# Patient Record
Sex: Male | Born: 1955
Health system: Southern US, Community
[De-identification: ages and names within clinical notes are randomized; demographics above are authoritative.]

## PROBLEM LIST (undated history)

## (undated) DIAGNOSIS — C801 Malignant (primary) neoplasm, unspecified: Secondary | ICD-10-CM

## (undated) DIAGNOSIS — K219 Gastro-esophageal reflux disease without esophagitis: Secondary | ICD-10-CM

## (undated) DIAGNOSIS — I4891 Unspecified atrial fibrillation: Secondary | ICD-10-CM

## (undated) HISTORY — PX: ANKLE ARTHROSCOPY: SUR85

## (undated) HISTORY — PX: COLONOSCOPY: SHX174

## (undated) HISTORY — PX: KNEE ARTHROSCOPY: SUR90

---

## 2000-11-07 ENCOUNTER — Encounter: Payer: Self-pay | Admitting: Orthopedic Surgery

## 2000-11-07 ENCOUNTER — Encounter: Payer: Self-pay | Admitting: Specialist

## 2000-11-07 ENCOUNTER — Inpatient Hospital Stay (HOSPITAL_COMMUNITY): Admission: EM | Admit: 2000-11-07 | Discharge: 2000-11-12 | Payer: Self-pay

## 2001-01-01 ENCOUNTER — Inpatient Hospital Stay (HOSPITAL_COMMUNITY): Admission: EM | Admit: 2001-01-01 | Discharge: 2001-01-02 | Payer: Self-pay

## 2004-03-29 ENCOUNTER — Emergency Department (HOSPITAL_COMMUNITY): Admission: EM | Admit: 2004-03-29 | Discharge: 2004-03-29 | Payer: Self-pay | Admitting: *Deleted

## 2004-11-26 ENCOUNTER — Encounter: Admission: RE | Admit: 2004-11-26 | Discharge: 2004-11-26 | Payer: Self-pay | Admitting: Orthopedic Surgery

## 2005-07-27 ENCOUNTER — Observation Stay (HOSPITAL_COMMUNITY): Admission: EM | Admit: 2005-07-27 | Discharge: 2005-07-28 | Payer: Self-pay | Admitting: Emergency Medicine

## 2006-02-24 ENCOUNTER — Emergency Department (HOSPITAL_COMMUNITY): Admission: EM | Admit: 2006-02-24 | Discharge: 2006-02-25 | Payer: Self-pay | Admitting: Emergency Medicine

## 2006-03-22 ENCOUNTER — Encounter: Admission: RE | Admit: 2006-03-22 | Discharge: 2006-03-22 | Payer: Self-pay | Admitting: Emergency Medicine

## 2007-06-19 ENCOUNTER — Encounter: Admission: RE | Admit: 2007-06-19 | Discharge: 2007-06-19 | Payer: Self-pay | Admitting: Emergency Medicine

## 2008-01-30 ENCOUNTER — Encounter: Admission: RE | Admit: 2008-01-30 | Discharge: 2008-01-30 | Payer: Self-pay | Admitting: Emergency Medicine

## 2008-08-13 ENCOUNTER — Emergency Department (HOSPITAL_BASED_OUTPATIENT_CLINIC_OR_DEPARTMENT_OTHER): Admission: EM | Admit: 2008-08-13 | Discharge: 2008-08-13 | Payer: Self-pay | Admitting: Emergency Medicine

## 2008-08-13 ENCOUNTER — Ambulatory Visit: Payer: Self-pay | Admitting: Radiology

## 2008-09-08 ENCOUNTER — Emergency Department (HOSPITAL_COMMUNITY): Admission: EM | Admit: 2008-09-08 | Discharge: 2008-09-08 | Payer: Self-pay | Admitting: Emergency Medicine

## 2008-10-02 ENCOUNTER — Emergency Department (HOSPITAL_COMMUNITY): Admission: EM | Admit: 2008-10-02 | Discharge: 2008-10-03 | Payer: Self-pay | Admitting: Emergency Medicine

## 2009-11-11 ENCOUNTER — Encounter: Admission: RE | Admit: 2009-11-11 | Discharge: 2009-11-11 | Payer: Self-pay | Admitting: Orthopedic Surgery

## 2010-08-25 LAB — POCT I-STAT, CHEM 8
BUN: 23 mg/dL (ref 6–23)
HCT: 43 % (ref 39.0–52.0)
Sodium: 139 mEq/L (ref 135–145)
TCO2: 22 mmol/L (ref 0–100)

## 2010-08-26 LAB — GLUCOSE, CAPILLARY: Glucose-Capillary: 91 mg/dL (ref 70–99)

## 2010-10-01 NOTE — Discharge Summary (Signed)
[place]. [facility]  Patient:    John Donaldson, John Donaldson                       MRN: [id number] Adm. Date:  [id number] Disc. Date: [id number] Attending:  Montez Morita, Philips John Dictator:   Druscilla Brownie. Shela Nevin, P.A. CC:         Rockey Situ. Flavia Shipper., M.D.  Helene Kelp, M.D.   Discharge Summary  ADMITTING DIAGNOSES: 1. Sprain strain right quadricep mechanism as well as to the toe and the ankle    on the right. 2. Under treatment with Levaquin for sinusitis. 3. Rule out infective process of multiple joints.  DISCHARGE DIAGNOSES: 1. Reactive polyarthritis. 2. Sinusitis, resolved.  CONSULTATIONS: 1. Dr. Lenn Sink, infectious disease. 2. Dr. Chase Picket, rheumatology.  PROCEDURE:  On November 08, 2000, the patient underwent irrigation of both knees and right ankle with cultures of synovial fluid.  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  The patient presented in the emergency room for evaluation.  He has been in good health prior to this admission.  He developed a sinusitis as well as a fever and was seen by his family physician who began him on Levaquin.  The patient also had tenderness in the prostate by his family physician and was treated for a mild prostatitis as well.  This is a very active gentleman and he was playing softball shortly before he began having increasing pain into his lower extremity joints.  He specifically remembers rounding second base stepping on the bag "wrong" and having pain into the knee.  He thought he had "torn" something.  He had increased pain and swelling into his knee and felt quite sick as well as having a fever.  He collapsed at home on the floor and due to the pain in his lower extremity joints, it took him some time to reach the telephone to call for EMS.   He was then brought to the emergency room.  In the emergency room, his main complaint was pain in the knees and effusion was seen in the right knee.  He also had some redness and effusion into the right ankle as well.  Movement of the right MP joint was also uncomfortable. He did have some left knee pain as well.  Due to the fever, as well as the history, it was felt this patient could have an infected process into his lower extremity joints and for this reason, he was taken to the operating room for the above procedure.  Gram stain showed 52,000 wbcs without crystals.  No organisms were seen.  It should be noted that the patient had been on Levaquin for his sinusitis prostatitis.  We asked Dr. Ponciano Ort to see the patient and he felt that due to the polyarthralgia that it may have been a rheumatologic process.  We also called for a rheumatology consult.  HOSPITAL COURSE:  Dr. Roxan Hockey recommended continuing the patient on doxycycline as some causes of reactive arthritis might respond to that. Dr. Phylliss Bob started the patient on IV Solu-Medrol.  With this treatment regimen, the patient did improve.  He was able to begin ambulating in the room and then into the hall with the aide of crutches.  He had no pronounced increases of effusion  to his knees or other lower extremity joints.  Overall, his discomfort improved and erythema faded.  He was switched to p.o. steroids prior to discharge.  Laboratory values continued to show no growth in the synovial fluid analysis.  White count remained normal.  Blood chemistries were also normal.  IgG was 980 which was normal, IgM was 112 which was normal. Hepatitis B surface antigen was negative.  Anti-HCV was negative.  Anti-HAV IgM was negative.  HB core IgM was negative.  Human parvo virus was pending as far as these notes were concerned. Rheumatoid factor was negative.  HLAB-27 was negative.  Unfortunately, sedimentation rate was never performed due  to "test request received without appropriate specimen."  No chest x-ray or electrocardiogram seen on this chart.  CONDITION ON DISCHARGE:  Improved and stable.  DISCHARGE MEDICATIONS: 1. Doxycycline 100 mg one p.o. b.i.d. x 7 days at discharge. 2. Prednisone.  Dr. Leslee Home saw the patient on the day of discharge and    recommended prednisone 10 mg one b.i.d.  FOLLOWUP:  He is to return to see Dr. Montez Morita on November 20, 2000. DD:  11/21/00 TD:  11/21/00 Job: [id number]

## 2010-10-01 NOTE — Op Note (Signed)
[place]. [facility]  Patient:    [name], [name] Visit Number: [id number] MRN: [id number]          Service Type: MED Location: 5000 [id number] Attending Physician:  Colbert Ewing Proc. Date: 11/08/00 Admit Date:  11/07/2000                             Operative Report  PREOPERATIVE DIAGNOSIS:  Septic arthritis, both knees, possible right ankle.  POSTOPERATIVE DIAGNOSIS:  Septic arthritis of both knees and right ankle.  OPERATION PERFORMED:  Irrigation of all three joints.  SURGEON:  Philips J. Montez Morita, M.D.  ANESTHESIA:  General.  DESCRIPTION OF PROCEDURE:  After suitable general anesthesia, both knees were prepped with a double hole draping system and lateral puncture wounds were made in the parapatellar area.  Introduction of a large arthroscopy inflow cannula which in both knees produces a thick, heavy obvious fluid very similar to what was aspirated on  the floor.  We then proceeded to run 3000 cc of fluid through each with clearing up right after the first 200 cc with an inflow portal created in each using an arthroscope through anterolateral arthroscope cannula through an anterolateral portal for the inflow.  At the conclusion of that 500 cc of triple antibiotic were used as the last irrigating solution and the two puncture wounds on both knees were closed. The right ankle was then prepped with Betadine and aspirated with an 18 needle going in over the area of fullness which was anterolaterally and got about 4 cc of thick pus.  We then filled that area with the 18 gauge needle in place with saline in a stab wound and brought in the cannula for the arthroscope and then irrigated through there until it was clear and wound up with 500 cc of triple antibiotic as well.  One suture in that area.  Compression dressing for that and he goes to recovery in good condition. Attending Physician:  Colbert Ewing DD:  11/08/00 TD:   11/08/00 Job: [id number]

## 2010-10-01 NOTE — Discharge Summary (Signed)
John Donaldson, John Donaldson                ACCOUNT NO.:  [id number]   MEDICAL RECORD NO.:  [id number]          PATIENT TYPE:  INP   LOCATION:  6525                         FACILITY:  [facility]   PHYSICIAN:  Nanetta Batty, M.D.   DATE OF BIRTH:  [birth year]   DATE OF ADMISSION:  07/27/2005  DATE OF DISCHARGE:  07/28/2005                                 DISCHARGE SUMMARY   DISCHARGE DIAGNOSES:  1.  Atrial flutter with moderate ventricular response converted to sinus      rhythm on Cardizem intravenous.  2.  Reactive polyarthritis with a history of septic arthritis of bilateral      both knees and right ankle.   HISTORY OF PRESENT ILLNESS/HOSPITAL COURSE:  This is a 55 year old Caucasian  gentleman who presented to [facility] with fluttering sensation in  his chest while drinking a cold water after playing golf.  There were no  pain, no shortness of breath or tightness or heaviness or pressure in the  chest.  No nausea, diaphoresis.  He was put on telemetry monitoring.  He was  found to be in atrial flutter with response, with heart rate in the 80s and  90s.   PAST MEDICAL HISTORY:  Significant for reactive polyarthritis in the past.  The patient underwent irrigation of both knees and right ankle for septic  arthritis and it was done in 2002.   SOCIAL HISTORY:  He is married with two children.  Works as a Emergency planning/management officer  in [place].  He doesn't smoke cigarettes and does not have any  significant alcohol consumption.   FAMILY HISTORY:  Negative for coronary artery disease.   He was admitted to the telemetry unit, started on IV Cardizem drip and  converted to normal sinus rhythm around 4 a.m. in the morning.  Dr. Allyson Sabal  saw the patient in the morning and considered him to be stable for discharge  home.   His laboratories were as following:  Sodium 141, potassium 3.6, chloride  108, CO2 29, BUN 18, creatinine 1.2, glucose 93.  His hemoglobin was 14.8,  hematocrit 41.8.  First set  of enzymes was CK 287, CK-MB 2.9, troponin 0.02.  Second set revealed CK of 245, CK-MB 1.6, troponin 0.02.  TSH was pending at  the time of discharge.   We are going to discharge the patient home with the following  recommendations:  1.  Start aspirin p.o. 81 mg daily.  2.  Continue regular activity and diet.   DISCHARGE FOLLOWUP:  He was scheduled for a 2-D echo in the second floor in  our office on 08/02/2005 at 10 o'clock in the morning and then Dr. Allyson Sabal  will see the patient on 08/04/2005 at 8:30 a.m.   The patient was given 40 mEq of potassium prior to his discharge home.      Raymon Mutton, P.A.      Nanetta Batty, M.D.  Electronically Signed    MK/MEDQ  D:  07/28/2005  T:  07/29/2005  Job:  [id number]

## 2010-10-01 NOTE — H&P (Signed)
[name], [name]                ACCOUNT NO.:  [id number]   MEDICAL RECORD NO.:  [id number]          PATIENT TYPE:  EMS   LOCATION:  MAJO                         FACILITY:  [facility]   PHYSICIAN:  Ulyses Amor, MD DATE OF BIRTH:  1955-06-09   DATE OF ADMISSION:  07/27/2005  DATE OF DISCHARGE:                                HISTORY & PHYSICAL   HISTORY OF PRESENT ILLNESS:  John Donaldson is a 55 year old white man who is  admitted to [facility] for atrial flutter.   The patient, who has no past history of cardiac disease, experienced the  onset of fluttering in his chest while drinking cold water after hitting  golf balls today.  There was no associated chest pain, tightness, heaviness,  pressure or squeezing, nor was then any dyspnea, diaphoresis, nausea,  dizziness, lightheadedness, syncope or near syncope.  He presented to the  emergency department where he was found to be in atrial flutter with a  moderate ventricular rate.  Hence, he is admitted for further evaluation and  management.   As noted, the patient has no past history of cardiac disease, including no  history of chest pain, myocardial infarction, coronary artery disease,  congestive heart failure, or arrhythmia.  He has no risk factors for  coronary artery disease including no history of hypertension,  hyperlipidemia, diagnosis, smoking or family history of early coronary  artery disease.   MEDICATIONS:  None.   ALLERGIES:  None.   OPERATIONS:  Bilateral knee and one ankle arthroscopies.   SIGNIFICANT INJURIES:  None.   SOCIAL HISTORY:  The patient lives with his wife.  He has 2 children  including a 47-month-old.  He is a Event organiser.  He does not  smoke cigarettes.  He has no significant alcohol consumption.   REVIEW OF SYSTEMS:  No problems related to his head, eyes, ears, nose,  mouth, throat, lungs, gastrointestinal system, genitourinary system, or  extremities.  There is no  history of neurologic or psychiatric disorder.  There is no history of fever, chills or weight loss.   PHYSICAL EXAMINATION:  VITAL SIGNS:  Blood pressure 101/73, pulse 83 and  regular, respirations 20, temperature 98.2.  GENERAL:  The patient was a middle-aged white man in no discomfort.  He was  alert, oriented, appropriate and responsive.  HEENT:  Head, eyes, nose and mouth were normal.  NECK:  Without thyromegaly or adenopathy.  Carotid pulses were palpable  bilaterally and without bruits.  CARDIAC:  A regular rhythm.  There was no gallop, murmur, rub or click.  CHEST:  No chest wall tenderness was noted.  LUNGS:  Clear.  ABDOMEN:  Soft and nontender.  There was no mass, hepatosplenomegaly, bruit,  distention, rebound, guarding or rigidity.  Bowel sounds were normal.  RECTAL/GENITAL:  Not performed, as they were not pertinent to the reason for  acute care hospitalization.  EXTREMITIES:  Without edema, deviation or deformity.  Radial and dorsalis  pedis pulses were palpable bilaterally.  NEUROLOGIC:  Brief screening neurologic survey was unremarkable.   ELECTROCARDIOGRAM:  Atrial flutter with a moderate  ventricular rate.  There  were nonspecific ST-T wave changes related to the flutter wave.  There were  no changes specific for ischemia or infarction.   CHEST RADIOGRAPH:  No evidence of acute cardiopulmonary disease.   LABORATORY DATA:  White count was 5.2 with a hemoglobin of 15.0 and  hematocrit of 41.9.  Potassium of 3.6, BUN 18, and creatinine 1.2.  The  initial set of cardiac markers revealed a myoglobin of 105, CK-MB 1.4 and  troponin less than 0.05.  The second set of cardiac markers revealed a  myoglobin of 57.2, CK-MB 1.2 and troponin less than 0.05.  The remaining  studies were pending at the time of this dictation.   IMPRESSION:  Atrial flutter with moderate ventricular rate, onset today.   PLAN:  1.  Telemetry.  2.  Serial cardiac enzymes.  3.  Aspirin.  4.   Intravenous heparin.  5.  Echocardiogram.  6.  Thyroid function tests.  7.  Further measures per Dr. Allyson Sabal.      Ulyses Amor, MD  Electronically Signed     MSC/MEDQ  D:  07/27/2005  T:  07/27/2005  Job:  [id number]   cc:   Nanetta Batty, M.D.  Fax: [phone]

## 2016-04-27 ENCOUNTER — Other Ambulatory Visit: Payer: Self-pay | Admitting: Internal Medicine

## 2016-04-27 DIAGNOSIS — N5089 Other specified disorders of the male genital organs: Secondary | ICD-10-CM

## 2016-05-04 ENCOUNTER — Ambulatory Visit
Admission: RE | Admit: 2016-05-04 | Discharge: 2016-05-04 | Disposition: A | Discharge status: Home / Self Care | Payer: Self-pay | Source: Ambulatory Visit | Attending: Internal Medicine | Admitting: Internal Medicine

## 2016-05-04 DIAGNOSIS — N5089 Other specified disorders of the male genital organs: Secondary | ICD-10-CM

## 2017-05-04 ENCOUNTER — Emergency Department (HOSPITAL_COMMUNITY): Payer: PRIVATE HEALTH INSURANCE

## 2017-05-04 ENCOUNTER — Other Ambulatory Visit: Payer: Self-pay

## 2017-05-04 ENCOUNTER — Encounter (HOSPITAL_COMMUNITY): Payer: Self-pay

## 2017-05-04 DIAGNOSIS — I4510 Unspecified right bundle-branch block: Secondary | ICD-10-CM | POA: Diagnosis not present

## 2017-05-04 DIAGNOSIS — R0789 Other chest pain: Secondary | ICD-10-CM | POA: Diagnosis not present

## 2017-05-04 DIAGNOSIS — R079 Chest pain, unspecified: Secondary | ICD-10-CM | POA: Diagnosis present

## 2017-05-04 LAB — BASIC METABOLIC PANEL
ANION GAP: 11 (ref 5–15)
BUN: 18 mg/dL (ref 6–20)
CO2: 24 mmol/L (ref 22–32)
Calcium: 8.5 mg/dL — ABNORMAL LOW (ref 8.9–10.3)
Chloride: 102 mmol/L (ref 101–111)
Creatinine, Ser: 0.94 mg/dL (ref 0.61–1.24)
Glucose, Bld: 123 mg/dL — ABNORMAL HIGH (ref 65–99)
POTASSIUM: 4.1 mmol/L (ref 3.5–5.1)
SODIUM: 137 mmol/L (ref 135–145)

## 2017-05-04 LAB — I-STAT TROPONIN, ED: Troponin i, poc: 0 ng/mL (ref 0.00–0.08)

## 2017-05-04 LAB — CBC
HEMATOCRIT: 41.5 % (ref 39.0–52.0)
Hemoglobin: 14.6 g/dL (ref 13.0–17.0)
MCH: 32.6 pg (ref 26.0–34.0)
MCHC: 35.2 g/dL (ref 30.0–36.0)
MCV: 92.6 fL (ref 78.0–100.0)
Platelets: 141 10*3/uL — ABNORMAL LOW (ref 150–400)
RBC: 4.48 MIL/uL (ref 4.22–5.81)
RDW: 13.4 % (ref 11.5–15.5)
WBC: 4.6 10*3/uL (ref 4.0–10.5)

## 2017-05-04 NOTE — ED Triage Notes (Signed)
Pt endorses eating at 1500 and after began to feel mid chest pressure without radiation, denies any other sx. Has hx of GERD and usually acid reflux med and baking soda relieves it but today it did not. Given asa 325 and 1 nitro with relief, now pain is 1/10. Has hx of a-fib. VSS. NSR with right BBB which is new.

## 2017-05-05 ENCOUNTER — Emergency Department (HOSPITAL_COMMUNITY)
Admission: EM | Admit: 2017-05-05 | Discharge: 2017-05-05 | Disposition: A | Discharge status: Home / Self Care | Payer: PRIVATE HEALTH INSURANCE | Attending: Emergency Medicine | Admitting: Emergency Medicine

## 2017-05-05 DIAGNOSIS — R0789 Other chest pain: Secondary | ICD-10-CM

## 2017-05-05 DIAGNOSIS — I4510 Unspecified right bundle-branch block: Secondary | ICD-10-CM

## 2017-05-05 HISTORY — DX: Unspecified atrial fibrillation: I48.91

## 2017-05-05 LAB — I-STAT TROPONIN, ED: TROPONIN I, POC: 0 ng/mL (ref 0.00–0.08)

## 2017-05-05 NOTE — ED Provider Notes (Signed)
[facility]   CSN: [id number] Arrival date & time: 05/04/17  2218     History   Chief Complaint Chief Complaint  Patient presents with  . Chest Pain    HPI John Donaldson is a 61 y.o. male.  The history is provided by the patient.  He has history of diabetes and one episode of atrial fibrillation.  This afternoon at about 2:30 PM, he started having indigestion and heartburn.  He described as a pressure feeling in his mid chest.  He was burping constantly but was not getting relief.  He rates that pressure feeling at 7/10.  There is no associated dyspnea, nausea, diaphoresis.  He has had similar episodes over the last several weeks and these have usually been related to food ingestion.  This episode occurred after eating some mixed nuts.  His wife gave him 4 baby aspirin, and his discomfort is gone away.  He has been symptom-free while in the ED.  He is a non-smoker.  There is no history of hypertension, hyperlipidemia.  There is no family history of premature coronary atherosclerosis.  Past Medical History:  Diagnosis Date  . Atrial fibrillation (HCC)    one occurence    There are no active problems to display for this patient.   History reviewed. No pertinent surgical history.     Home Medications    Prior to Admission medications   Not on File    Family History History reviewed. No pertinent family history.  Social History Social History   Tobacco Use  . Smoking status: Never Smoker  Substance Use Topics  . Alcohol use: Yes    Comment: rare  . Drug use: No     Allergies   Patient has no known allergies.   Review of Systems Review of Systems  All other systems reviewed and are negative.    Physical Exam Updated Vital Signs BP 135/85 (BP Location: Right Arm)   Pulse 75   Temp 98.4 F (36.9 C) (Oral)   Resp 18   Ht 6\' 5"  (1.956 m)   Wt 114.8 kg (253 lb)   SpO2 98%   BMI 30.00 kg/m    Physical Exam  Nursing note and vitals reviewed.  61 year old male, resting comfortably and in no acute distress. Vital signs are normal. Oxygen saturation is 98%, which is normal. Head is normocephalic and atraumatic. PERRLA, EOMI. Oropharynx is clear. Neck is nontender and supple without adenopathy or JVD. Back is nontender and there is no CVA tenderness. Lungs are clear without rales, wheezes, or rhonchi. Chest is nontender. Heart has regular rate and rhythm without murmur. Abdomen is soft, flat, nontender without masses or hepatosplenomegaly and peristalsis is normoactive.  There is a negative Murphy sign. Extremities have no cyanosis or edema, full range of motion is present. Skin is warm and dry without rash. Neurologic: Mental status is normal, cranial nerves are intact, there are no motor or sensory deficits.  ED Treatments / Results  Labs (all labs ordered are listed, but only abnormal results are displayed) Labs Reviewed  BASIC METABOLIC PANEL - Abnormal; Notable for the following components:      Result Value   Glucose, Bld 123 (*)    Calcium 8.5 (*)    All other components within normal limits  CBC - Abnormal; Notable for the following components:   Platelets 141 (*)    All other components within normal limits  I-STAT TROPONIN, ED  I-STAT TROPONIN, ED    EKG  EKG Interpretation  Date/Time:  Thursday May 04 2017 22:19:20 EST Ventricular Rate:  79 PR Interval:  194 QRS Duration: 136 QT Interval:  418 QTC Calculation: 479 R Axis:   109 Text Interpretation:  Normal sinus rhythm Right bundle branch block Abnormal ECG When compared with ECG of 09/08/2008, Right bundle branch block is now present Confirmed by Dione BoozeGlick, Pranshu Lyster ([id number]) on 05/05/2017 12:31:52 AM       Radiology Dg Chest 2 View  Result Date: 05/04/2017 CLINICAL DATA:  Chest pain EXAM: CHEST  2 VIEW COMPARISON:  08/13/2008 FINDINGS: Mild bibasilar atelectasis. No focal consolidation or effusion.  Normal heart size. No pneumothorax. IMPRESSION: No active cardiopulmonary disease.  Mild bibasilar atelectasis Electronically Signed   By: Jasmine PangKim  Fujinaga M.D.   On: 05/04/2017 23:05    Procedures Procedures (including critical care time)  Medications Ordered in ED Medications - No data to display   Initial Impression / Assessment and Plan / ED Course  I have reviewed the triage vital signs and the nursing notes.  Pertinent labs & imaging results that were available during my care of the patient were reviewed by me and considered in my medical decision making (see chart for details).  Chest discomfort which is very likely gastrointestinal in origin.  Certainly consider possibility of biliary colic.  However, no acute findings currently.  ECG shows new right bundle branch block compared with 2010.  This does not have any significance for evaluation of possible ACS.  Heart score is 2, which puts him at very low risk of major adverse cardiac events in the next 30 days.  Initial troponin is normal.  Will check delta troponin.  If negative, will refer back to PCP for consideration for outpatient stress testing and consideration for outpatient gallbladder ultrasound.  Old records are reviewed, and he has no relevant past visits.  Repeat troponin is normal.  Patient is advised to take low-dose aspirin until cleared by cardiology.  Return precautions discussed.  Final Clinical Impressions(s) / ED Diagnoses   Final diagnoses:  Atypical chest pain  Right bundle branch block    ED Discharge Orders    None       Dione BoozeGlick, Kycen Spalla, MD 05/05/17 0330

## 2017-05-05 NOTE — ED Notes (Signed)
Pt departed in NAD, refused use of wheelchair.  

## 2017-05-05 NOTE — Discharge Instructions (Signed)
Please take a low-dose (81 mg) aspirin every day until you see a cardiologist. Return if the discomfort comes back. You should have a stress test to look for heart problems. If that is ok, you should consider getting an ultrasound looking for gallstones.

## 2019-08-09 ENCOUNTER — Ambulatory Visit: Payer: BC Managed Care – PPO | Attending: Internal Medicine

## 2019-08-09 DIAGNOSIS — Z23 Encounter for immunization: Secondary | ICD-10-CM

## 2019-08-09 NOTE — Progress Notes (Signed)
   Covid-19 Vaccination Clinic  Name:  John Donaldson    MRN: [id number] DOB: 04-15-1956  08/09/2019  [name] was observed post Covid-19 immunization for 15 minutes without incident. He was provided with Vaccine Information Sheet and instruction to access the V-Safe system.   [name] was instructed to call 911 with any severe reactions post vaccine: Marland Kitchen Difficulty breathing  . Swelling of face and throat  . A fast heartbeat  . A bad rash all over body  . Dizziness and weakness   Immunizations Administered    Name Date Dose VIS Date Route   Pfizer COVID-19 Vaccine 08/09/2019  9:44 AM 0.3 mL 04/26/2019 Intramuscular   Manufacturer: [employer], [employer]   Lot: [id number]   NDC: [address]-0

## 2019-09-04 ENCOUNTER — Ambulatory Visit: Payer: BC Managed Care – PPO | Attending: Internal Medicine

## 2019-09-04 DIAGNOSIS — Z23 Encounter for immunization: Secondary | ICD-10-CM

## 2019-09-04 NOTE — Progress Notes (Signed)
   Covid-19 Vaccination Clinic  Name:  [name]    MRN: [id number] DOB: [birth year]  09/04/2019  [name] was observed post Covid-19 immunization for 15 minutes without incident. He was provided with Vaccine Information Sheet and instruction to access the V-Safe system.   [name] was instructed to call 911 with any severe reactions post vaccine: Marland Kitchen Difficulty breathing  . Swelling of face and throat  . A fast heartbeat  . A bad rash all over body  . Dizziness and weakness   Immunizations Administered    Name Date Dose VIS Date Route   Pfizer COVID-19 Vaccine 09/04/2019  9:21 AM 0.3 mL 07/10/2018 Intramuscular   Manufacturer: [employer], [employer]   Lot: [id number]   NDC: [address]-9

## 2019-09-25 ENCOUNTER — Other Ambulatory Visit (HOSPITAL_COMMUNITY): Payer: Self-pay | Admitting: Internal Medicine

## 2019-11-25 DIAGNOSIS — M542 Cervicalgia: Secondary | ICD-10-CM | POA: Insufficient documentation

## 2020-04-15 MED FILL — SYNJARDY XR 25-1000 MG TB24: 25-1000 | 90 days supply | Qty: 90 | Fill #0

## 2020-04-15 MED FILL — VASCEPA 1 GM CAPSULE: 1 | 30 days supply | Qty: 120 | Fill #0

## 2020-05-07 ENCOUNTER — Other Ambulatory Visit (HOSPITAL_COMMUNITY): Payer: Self-pay | Admitting: Internal Medicine

## 2020-05-09 MED FILL — VASCEPA 1 GM CAPSULE: 1 | 30 days supply | Qty: 120 | Fill #0

## 2020-06-16 MED FILL — VASCEPA 1 GM CAPSULE: 1 | 30 days supply | Qty: 120 | Fill #1

## 2020-06-25 ENCOUNTER — Other Ambulatory Visit (HOSPITAL_COMMUNITY): Payer: Self-pay | Admitting: Internal Medicine

## 2020-06-28 MED FILL — SYNJARDY XR 25-1000 MG TB24: 25-1000 | 90 days supply | Qty: 90 | Fill #0

## 2020-07-15 MED FILL — SYNJARDY XR 25-1000 MG TB24: 25-1000 | 90 days supply | Qty: 90 | Fill #0

## 2020-07-15 MED FILL — VASCEPA 1 GM CAPSULE: 1 | 30 days supply | Qty: 120 | Fill #2

## 2020-07-17 ENCOUNTER — Other Ambulatory Visit (HOSPITAL_COMMUNITY): Payer: Self-pay | Admitting: Family Medicine

## 2020-07-17 MED FILL — CEFDINIR 300 MG CAPS: 300 | 10 days supply | Qty: 20 | Fill #0

## 2020-08-21 ENCOUNTER — Other Ambulatory Visit (HOSPITAL_COMMUNITY): Payer: Self-pay

## 2020-08-21 MED FILL — Icosapent Ethyl Cap 1 GM: ORAL | 30 days supply | Qty: 120 | Fill #0 | Status: AC

## 2020-09-02 ENCOUNTER — Other Ambulatory Visit: Payer: Self-pay | Admitting: Internal Medicine

## 2020-09-02 DIAGNOSIS — E781 Pure hyperglyceridemia: Secondary | ICD-10-CM

## 2020-09-14 ENCOUNTER — Other Ambulatory Visit (HOSPITAL_COMMUNITY): Payer: Self-pay

## 2020-09-14 MED ORDER — FLUTICASONE PROPIONATE 50 MCG/ACT NA SUSP
NASAL | 3 refills | Status: DC
  Filled 2020-09-14: qty 48, 90d supply, fill #0
  Filled 2021-02-26: qty 48, 90d supply, fill #1

## 2020-09-14 MED FILL — Icosapent Ethyl Cap 1 GM: ORAL | 30 days supply | Qty: 120 | Fill #1 | Status: AC

## 2020-09-16 ENCOUNTER — Other Ambulatory Visit (HOSPITAL_COMMUNITY): Payer: Self-pay

## 2020-09-18 ENCOUNTER — Ambulatory Visit
Admission: RE | Admit: 2020-09-18 | Discharge: 2020-09-18 | Disposition: A | Discharge status: Home / Self Care | Payer: Self-pay | Source: Ambulatory Visit | Attending: Internal Medicine | Admitting: Internal Medicine

## 2020-09-18 ENCOUNTER — Other Ambulatory Visit: Payer: Self-pay

## 2020-09-18 DIAGNOSIS — E781 Pure hyperglyceridemia: Secondary | ICD-10-CM

## 2020-10-15 ENCOUNTER — Other Ambulatory Visit (HOSPITAL_COMMUNITY): Payer: Self-pay

## 2020-10-15 MED FILL — Empagliflozin-Metformin HCl Tab ER 24HR 25-1000 MG: ORAL | 90 days supply | Qty: 90 | Fill #0 | Status: AC

## 2020-10-15 MED FILL — Icosapent Ethyl Cap 1 GM: ORAL | 30 days supply | Qty: 120 | Fill #2 | Status: AC

## 2020-10-16 ENCOUNTER — Other Ambulatory Visit (HOSPITAL_COMMUNITY): Payer: Self-pay

## 2020-10-26 ENCOUNTER — Other Ambulatory Visit (HOSPITAL_COMMUNITY): Payer: Self-pay

## 2020-10-26 MED FILL — Empagliflozin-Metformin HCl Tab ER 24HR 25-1000 MG: ORAL | 90 days supply | Qty: 90 | Fill #1 | Status: CN

## 2020-11-22 MED FILL — Icosapent Ethyl Cap 1 GM: ORAL | 30 days supply | Qty: 120 | Fill #3 | Status: AC

## 2020-11-23 ENCOUNTER — Other Ambulatory Visit (HOSPITAL_COMMUNITY): Payer: Self-pay

## 2020-12-25 ENCOUNTER — Other Ambulatory Visit (HOSPITAL_COMMUNITY): Payer: Self-pay

## 2020-12-25 MED FILL — Icosapent Ethyl Cap 1 GM: ORAL | 30 days supply | Qty: 120 | Fill #4 | Status: AC

## 2021-01-13 ENCOUNTER — Other Ambulatory Visit (HOSPITAL_COMMUNITY): Payer: Self-pay

## 2021-01-13 MED ORDER — ROSUVASTATIN CALCIUM 10 MG PO TABS
ORAL_TABLET | ORAL | 3 refills | Status: DC
  Filled 2021-01-13: qty 90, 90d supply, fill #0

## 2021-01-13 MED FILL — Empagliflozin-Metformin HCl Tab ER 24HR 25-1000 MG: ORAL | 90 days supply | Qty: 90 | Fill #1 | Status: AC

## 2021-01-14 ENCOUNTER — Other Ambulatory Visit (HOSPITAL_COMMUNITY): Payer: Self-pay

## 2021-01-24 MED FILL — Icosapent Ethyl Cap 1 GM: ORAL | 30 days supply | Qty: 120 | Fill #5 | Status: AC

## 2021-01-25 ENCOUNTER — Other Ambulatory Visit (HOSPITAL_COMMUNITY): Payer: Self-pay

## 2021-02-08 ENCOUNTER — Other Ambulatory Visit (HOSPITAL_COMMUNITY): Payer: Self-pay

## 2021-02-08 MED ORDER — CLENPIQ 10-3.5-12 MG-GM -GM/160ML PO SOLN
ORAL | 0 refills | Status: DC
  Filled 2021-02-08: qty 320, 1d supply, fill #0

## 2021-02-12 ENCOUNTER — Other Ambulatory Visit (HOSPITAL_COMMUNITY): Payer: Self-pay

## 2021-02-25 ENCOUNTER — Other Ambulatory Visit (HOSPITAL_COMMUNITY): Payer: Self-pay

## 2021-02-25 MED FILL — Icosapent Ethyl Cap 1 GM: ORAL | 30 days supply | Qty: 120 | Fill #6 | Status: AC

## 2021-02-26 ENCOUNTER — Other Ambulatory Visit (HOSPITAL_COMMUNITY): Payer: Self-pay

## 2021-03-05 ENCOUNTER — Ambulatory Visit (INDEPENDENT_AMBULATORY_CARE_PROVIDER_SITE_OTHER): Payer: No Typology Code available for payment source | Admitting: Cardiovascular Disease

## 2021-03-05 ENCOUNTER — Other Ambulatory Visit: Payer: Self-pay

## 2021-03-05 ENCOUNTER — Encounter: Payer: Self-pay | Admitting: Cardiovascular Disease

## 2021-03-05 DIAGNOSIS — R931 Abnormal findings on diagnostic imaging of heart and coronary circulation: Secondary | ICD-10-CM

## 2021-03-05 DIAGNOSIS — E785 Hyperlipidemia, unspecified: Secondary | ICD-10-CM | POA: Insufficient documentation

## 2021-03-05 DIAGNOSIS — I4510 Unspecified right bundle-branch block: Secondary | ICD-10-CM | POA: Diagnosis not present

## 2021-03-05 DIAGNOSIS — E782 Mixed hyperlipidemia: Secondary | ICD-10-CM | POA: Diagnosis not present

## 2021-03-05 NOTE — Progress Notes (Signed)
03/05/2021 John Donaldson   [birth year]  [id number]  Primary Physician Chilton Greathouse, MD Primary Cardiologist: Runell Gess MD Milagros Loll, [place], [place]  HPI:  John Donaldson is a 65 y.o. mildly overweight but otherwise fit appearing married Caucasian male who is accompanied by his wife John Donaldson today who works for [employer].  He was referred by Dr. Felipa Eth, his PCP, because of an elevated coronary calcium score.  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  He now does as needed consulting on homicide cases.  History of factors include untreated hyperlipidemia because of statin intolerance and diabetes.  There is no family history for heart disease other than a father who had CAD in his mid 64s.  He is never had a heart attack or stroke.  He denies chest pain or shortness of breath.  He is very active and plays golf twice a week and works out 3-4 times a week as well without symptoms.  He had a coronary calcium score performed by Dr. Felipa Eth  09/18/2020 which was 1051 with calcium primarily in the LAD and RCA although he is completely asymptomatic.   Current Meds  Medication Sig   cetirizine (ZYRTEC) 10 MG tablet Take 10 mg by mouth daily.   Empagliflozin-metFORMIN HCl ER (SYNJARDY XR) 25-1000 MG TB24 Take by mouth daily.   fluticasone (FLONASE) 50 MCG/ACT nasal spray INHALE 2 SPRAYS IN EACH NOSTRIL ONCE A DAY   icosapent Ethyl (VASCEPA) 1 g capsule TAKE 2 CAPSULES BY MOUTH TWO TIMES DAILY   rosuvastatin (CRESTOR) 10 MG tablet Take 1 tablet by mouth once daily     No Known Allergies  Social History   Socioeconomic History   Marital status: Married    Spouse name: Not on file   Number of children: Not on file   Years of education: Not on file   Highest education level: Not on  file  Occupational History   Not on file  Tobacco Use   Smoking status: Former    Types: Cigarettes, Cigars   Smokeless tobacco: Former    Quit date: 05/16/1992  Substance and Sexual Activity   Alcohol use: Yes    Comment: rare   Drug use: No   Sexual activity: Not on file  Other Topics Concern   Not on file  Social History Narrative   Not on file   Social Determinants of Health   Financial Resource Strain: Not on file  Food Insecurity: Not on file  Transportation Needs: Not on file  Physical Activity: Not on file  Stress: Not on file  Social Connections: Not on file  Intimate Partner Violence: Not on file     Review of Systems: General: negative for chills, fever, night sweats or weight changes.  Cardiovascular: negative for chest pain, dyspnea on exertion, edema, orthopnea, palpitations, paroxysmal nocturnal dyspnea or shortness of breath Dermatological: negative for rash Respiratory: negative for cough or wheezing Urologic: negative for hematuria Abdominal: negative for nausea, vomiting, diarrhea, bright red blood per rectum, melena, or hematemesis Neurologic: negative for visual changes, syncope, or dizziness All other systems reviewed and are otherwise negative except as noted above.    Blood pressure 126/78, pulse 65, height 6\' 5"  (1.956 m), weight 243 lb 3.2 oz (110.3 kg),  SpO2 98 %.  General appearance: alert and no distress Neck: no adenopathy, no carotid bruit, no JVD, supple, symmetrical, trachea midline, and thyroid not enlarged, symmetric, no tenderness/mass/nodules Lungs: clear to auscultation bilaterally Heart: regular rate and rhythm, S1, S2 normal, no murmur, click, rub or gallop Extremities: extremities normal, atraumatic, no cyanosis or edema Pulses: 2+ and symmetric Skin: Skin color, texture, turgor normal. No rashes or lesions Neurologic: Grossly normal  EKG sinus rhythm at 65 with right bundle branch block.  I personally reviewed this  EKG.  ASSESSMENT AND PLAN:   Hyperlipidemia History of hyperlipidemia intolerant to statin therapy with lipid profile performed by his PCP 08/24/2020 revealing a total cholesterol 266, LDL 156 and HDL 37.  Given his elevated coronary calcium score I am going to begin him on a PCSK9 with an LDL goal of less than 70.  Elevated coronary artery calcium score Coronary calcium score performed 09/18/2020 which was 1051.  Calcium was distributed primarily in the LAD and RCA.  He is very active and completely asymptomatic.  I am going to get a exercise Myoview stress test to rule out obstructive disease as well as begin him on a PCSK9 with a goal of LDL less than 70.  Right bundle branch block Chronic     Runell Gess MD [facility], [facility] 03/05/2021 9:43 AM

## 2021-03-05 NOTE — Assessment & Plan Note (Signed)
Coronary calcium score performed 09/18/2020 which was 1051.  Calcium was distributed primarily in the LAD and RCA.  He is very active and completely asymptomatic.  I am going to get a exercise Myoview stress test to rule out obstructive disease as well as begin him on a PCSK9 with a goal of LDL less than 70.

## 2021-03-05 NOTE — Assessment & Plan Note (Signed)
History of hyperlipidemia intolerant to statin therapy with lipid profile performed by his PCP 08/24/2020 revealing a total cholesterol 266, LDL 156 and HDL 37.  Given his elevated coronary calcium score I am going to begin him on a PCSK9 with an LDL goal of less than 70.

## 2021-03-05 NOTE — Patient Instructions (Signed)
Medication Instructions:  Your physician recommends that you continue on your current medications as directed. Please refer to the Current Medication list given to you today.  *If you need a refill on your cardiac medications before your next appointment, please call your pharmacy*  Testing/Procedures: Your physician has requested that you have an exercise stress myoview. For further information please visit [email or web address] Please follow instruction sheet, as given.  Follow-Up: At [facility], you and your health needs are our priority.  As part of our continuing mission to provide you with exceptional heart care, we have created designated Provider Care Teams.  These Care Teams include your primary Cardiologist (physician) and Advanced Practice Providers (APPs -  Physician Assistants and Nurse Practitioners) who all work together to provide you with the care you need, when you need it.  We recommend signing up for the patient portal called "MyChart".  Sign up information is provided on this After Visit Summary.  MyChart is used to connect with patients for Virtual Visits (Telemedicine).  Patients are able to view lab/test results, encounter notes, upcoming appointments, etc.  Non-urgent messages can be sent to your provider as well.   To learn more about what you can do with MyChart, go to [email or web address].    Your next appointment:   3 month(s)  The format for your next appointment:   In Person  Provider:   Nanetta Batty, MD

## 2021-03-05 NOTE — Assessment & Plan Note (Signed)
Chronic. 

## 2021-03-12 ENCOUNTER — Ambulatory Visit (HOSPITAL_COMMUNITY)
Admission: RE | Admit: 2021-03-12 | Payer: PRIVATE HEALTH INSURANCE | Source: Ambulatory Visit | Attending: Cardiovascular Disease | Admitting: Cardiovascular Disease

## 2021-03-18 ENCOUNTER — Telehealth (HOSPITAL_COMMUNITY): Payer: Self-pay | Admitting: *Deleted

## 2021-03-18 NOTE — Telephone Encounter (Signed)
Close encounter 

## 2021-03-19 ENCOUNTER — Encounter (HOSPITAL_COMMUNITY): Payer: PRIVATE HEALTH INSURANCE

## 2021-03-19 ENCOUNTER — Ambulatory Visit (HOSPITAL_COMMUNITY)
Admission: RE | Admit: 2021-03-19 | Discharge: 2021-03-19 | Disposition: A | Discharge status: Home / Self Care | Payer: No Typology Code available for payment source | Source: Ambulatory Visit | Attending: Cardiology | Admitting: Cardiology

## 2021-03-19 ENCOUNTER — Other Ambulatory Visit: Payer: Self-pay

## 2021-03-19 DIAGNOSIS — R931 Abnormal findings on diagnostic imaging of heart and coronary circulation: Secondary | ICD-10-CM | POA: Diagnosis present

## 2021-03-19 LAB — MYOCARDIAL PERFUSION IMAGING
Angina Index: 0
Base ST Depression (mm): 0 mm
Duke Treadmill Score: 9
Estimated workload: 10.1
Exercise duration (min): 8 min
Exercise duration (sec): 31 s
LV dias vol: 43 mL (ref 62–150)
LV sys vol: 111 mL
MPHR: 157 {beats}/min
Nuc Stress EF: 61 %
Peak HR: 141 {beats}/min
Percent HR: 89 %
Rest HR: 65 {beats}/min
Rest Nuclear Isotope Dose: 9.8 mCi
SDS: 0
SRS: 1
SSS: 1
ST Depression (mm): 0 mm
Stress Nuclear Isotope Dose: 30.3 mCi
TID: 0.87

## 2021-03-19 MED ORDER — TECHNETIUM TC 99M TETROFOSMIN IV KIT
9.8000 | PACK | Freq: Once | INTRAVENOUS | Status: AC | PRN
  Administered 2021-03-19: 9.8 via INTRAVENOUS
  Filled 2021-03-19: qty 10

## 2021-03-19 MED ORDER — TECHNETIUM TC 99M TETROFOSMIN IV KIT
30.3000 | PACK | Freq: Once | INTRAVENOUS | Status: AC | PRN
  Administered 2021-03-19: 30.3 via INTRAVENOUS
  Filled 2021-03-19: qty 31

## 2021-03-19 NOTE — Telephone Encounter (Signed)
Please advise 

## 2021-03-23 ENCOUNTER — Encounter: Payer: Self-pay | Admitting: Pharmacist

## 2021-03-24 ENCOUNTER — Other Ambulatory Visit (HOSPITAL_COMMUNITY): Payer: Self-pay

## 2021-03-24 ENCOUNTER — Telehealth: Payer: Self-pay | Admitting: Pharmacist

## 2021-03-24 ENCOUNTER — Ambulatory Visit (INDEPENDENT_AMBULATORY_CARE_PROVIDER_SITE_OTHER): Payer: No Typology Code available for payment source | Admitting: Pharmacist

## 2021-03-24 ENCOUNTER — Other Ambulatory Visit: Payer: Self-pay

## 2021-03-24 VITALS — BP 116/76 | HR 72 | Resp 16 | Ht 77.0 in | Wt 242.0 lb

## 2021-03-24 DIAGNOSIS — M791 Myalgia, unspecified site: Secondary | ICD-10-CM | POA: Diagnosis not present

## 2021-03-24 DIAGNOSIS — T466X5A Adverse effect of antihyperlipidemic and antiarteriosclerotic drugs, initial encounter: Secondary | ICD-10-CM | POA: Diagnosis not present

## 2021-03-24 DIAGNOSIS — R931 Abnormal findings on diagnostic imaging of heart and coronary circulation: Secondary | ICD-10-CM | POA: Diagnosis not present

## 2021-03-24 DIAGNOSIS — E782 Mixed hyperlipidemia: Secondary | ICD-10-CM | POA: Diagnosis not present

## 2021-03-24 DIAGNOSIS — E785 Hyperlipidemia, unspecified: Secondary | ICD-10-CM

## 2021-03-24 MED ORDER — REPATHA SURECLICK 140 MG/ML ~~LOC~~ SOAJ
140.0000 mg | SUBCUTANEOUS | 11 refills | Status: DC
  Filled 2021-03-24: qty 2, 28d supply, fill #0
  Filled 2021-04-12: qty 2, 28d supply, fill #1
  Filled 2021-05-16: qty 2, 28d supply, fill #2
  Filled 2021-06-14: qty 2, 28d supply, fill #3
  Filled 2021-07-02: qty 2, 28d supply, fill #4

## 2021-03-24 MED ORDER — ICOSAPENT ETHYL 1 G PO CAPS
ORAL_CAPSULE | Freq: Two times a day (BID) | ORAL | 1 refills | Status: DC
  Filled 2021-03-24: qty 360, 90d supply, fill #0
  Filled 2021-06-28: qty 360, 90d supply, fill #1

## 2021-03-24 NOTE — Telephone Encounter (Signed)
Please complete prior authorization for:  Name of medication, dose, and frequency Repatha 140mg  sq q 14 days or Praluent 150mg  sq q 14 days  Lab Orders Requested? yes  Which labs? Lipid panel  Estimated date for labs to be scheduled 2-3 months  Does patient need activated copay card? yes

## 2021-03-24 NOTE — Patient Instructions (Addendum)
It was nice meeting you today  We would like to be aggressive with your cholesterol and try to reduce your LDL to less than 55  We will start you on a new medication you will inject once every 2 weeks  We will complete the prior authorization for you and activate a copay card  Once you start the medication, we will update your lab work in 2-3 months  Please call with any questions!  Laural Golden, PharmD, BCACP, CDCES, CPP [address] [place], [place], [address] Phone: [phone], Fax: [phone]

## 2021-03-24 NOTE — Telephone Encounter (Signed)
Called and spoke to pt and stated that they were approved for the repatha, rxsent, copay card emailed to pt and instructed them to complete fasting labs post 4th dose and they voiced understanding

## 2021-03-24 NOTE — Progress Notes (Signed)
Patient ID: John Donaldson                 DOB: 03-30-1956                    MRN: [id number]     HPI: KATHAN Donaldson is a 65 y.o. male patient referred to lipid clinic by Dr Allyson Sabal. PMH is significant for elevated coronary calcium score, HLD, RBBB, statin intolerance, and T2DM (A1c 7.9 on 01/13/21).  Patient presents today in good spirits.  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  PCP ran coronary CT on 09/18/20 which showed total agatson score of 151.  Reports no chest pain.  Patient had episode of hypertriglyceridiam in January 2019.  Had not eaten healthy over New Years and triglycerides increased to 3453.  Went on diet/weight loss plan and was able to reduce triglycerides to 103 in one month.  Patient reports he sparingly drinks alcohol. Was started on Vascepa 2 g BID.  Has a family history of CAD on his mother and father's side. Both still living.  No chest pain, no SOB  Has trialed both atorvastatin and rosuvastatin.  Both caused muscle soreness which kept him awake at night.  Symptoms dissipated after discontinuing.   Current Medications: Vascepa 2g BID, Crestor 10mg  daily Intolerances: aches from statin Risk Factors: Coronary calcium score, family history, DM, CAD, HLD LDL goal: <55  Labs: HDL 33, LDL 127, Trigs 455, TC 251 (01/13/21)  IMPRESSION: 1. Three-vessel coronary artery calcification.   2. Total Agatston Score: 151   3. MESA age and sex matched database percentile: 47  Past Medical History:  Diagnosis Date   Atrial fibrillation (HCC)    one occurence    Current Outpatient Medications on File Prior to Visit  Medication Sig Dispense Refill   cefdinir (OMNICEF) 300 MG capsule TAKE 1 CAPSULE BY MOUTH AT BREAKFAST AND SUPPER FOR 10 DAYS. (Patient not taking: Reported on 03/05/2021) 20 capsule 0   cetirizine (ZYRTEC) 10 MG tablet Take 10 mg by mouth daily.     Empagliflozin-metFORMIN HCl ER (SYNJARDY XR) 25-1000 MG TB24 Take by mouth daily.      Empagliflozin-metFORMIN HCl ER 25-1000 MG TB24 TAKE 1 TABLET BY MOUTH ONCE A DAY (Patient not taking: Reported on 03/05/2021) 90 tablet 3   fluticasone (FLONASE) 50 MCG/ACT nasal spray INHALE 2 SPRAYS IN EACH NOSTRIL ONCE A DAY 48 g 3   icosapent Ethyl (VASCEPA) 1 g capsule TAKE 2 CAPSULES BY MOUTH TWO TIMES DAILY 120 capsule 11   rosuvastatin (CRESTOR) 10 MG tablet Take 1 tablet by mouth once daily 90 tablet 3   Sod Picosulfate-Mag Ox-Cit Acd (CLENPIQ) 10-3.5-12 MG-GM -GM/160ML SOLN Drink 160 ml by mouth as directed (Patient not taking: Reported on 03/05/2021) 320 mL 0   No current facility-administered medications on file prior to visit.    No Known Allergies  Assessment/Plan:  1. Hyperlipidemia - Patient last LDL 127 which is above goal of <55.  Aggressive goal chosen due to DM, family history, and elevated coronary calcium score.  Sicne patient intolerant to statins, recommended patient start PCSK9i.  Using demo pen, explained mechanism of action, storage, site selection, administration, and possible adverse effects.  Patient was able to demonstrate in room.  Will complete PA and activate copay card.  Patient would also be good candidate for GLPA1 due to DM and CAD.  Would like to discuss with PCP.  Will recheck lipid panel in 2-3 months  Laural Golden, PharmD, BCACP, CDCES, CPP [address] [place], [place], [address] Phone: [phone], Fax: [phone]

## 2021-03-25 ENCOUNTER — Other Ambulatory Visit (HOSPITAL_COMMUNITY): Payer: Self-pay

## 2021-04-12 ENCOUNTER — Other Ambulatory Visit (HOSPITAL_COMMUNITY): Payer: Self-pay

## 2021-04-15 ENCOUNTER — Other Ambulatory Visit (HOSPITAL_COMMUNITY): Payer: Self-pay

## 2021-04-15 MED ORDER — SYNJARDY XR 25-1000 MG PO TB24
ORAL_TABLET | ORAL | 3 refills | Status: DC
  Filled 2021-04-15: qty 90, 90d supply, fill #0
  Filled 2021-07-08: qty 90, 90d supply, fill #1

## 2021-04-16 ENCOUNTER — Other Ambulatory Visit (HOSPITAL_COMMUNITY): Payer: Self-pay

## 2021-05-04 ENCOUNTER — Other Ambulatory Visit (HOSPITAL_COMMUNITY): Payer: Self-pay

## 2021-05-04 MED ORDER — LAGEVRIO 200 MG PO CAPS
ORAL_CAPSULE | ORAL | 0 refills | Status: DC
  Filled 2021-05-04: qty 40, 5d supply, fill #0

## 2021-05-17 ENCOUNTER — Other Ambulatory Visit (HOSPITAL_COMMUNITY): Payer: Self-pay

## 2021-05-26 ENCOUNTER — Ambulatory Visit (INDEPENDENT_AMBULATORY_CARE_PROVIDER_SITE_OTHER): Payer: PRIVATE HEALTH INSURANCE | Admitting: Cardiovascular Disease

## 2021-05-26 ENCOUNTER — Other Ambulatory Visit: Payer: Self-pay

## 2021-05-26 ENCOUNTER — Encounter: Payer: Self-pay | Admitting: Cardiovascular Disease

## 2021-05-26 VITALS — BP 128/80 | HR 68 | Resp 20 | Ht 77.0 in | Wt 243.0 lb

## 2021-05-26 DIAGNOSIS — E785 Hyperlipidemia, unspecified: Secondary | ICD-10-CM

## 2021-05-26 DIAGNOSIS — R931 Abnormal findings on diagnostic imaging of heart and coronary circulation: Secondary | ICD-10-CM

## 2021-05-26 LAB — HEPATIC FUNCTION PANEL
ALT: 18 IU/L (ref 0–44)
AST: 18 IU/L (ref 0–40)
Albumin: 4.7 g/dL (ref 3.8–4.8)
Alkaline Phosphatase: 71 IU/L (ref 44–121)
Bilirubin Total: 0.4 mg/dL (ref 0.0–1.2)
Bilirubin, Direct: 0.12 mg/dL (ref 0.00–0.40)
Total Protein: 7.1 g/dL (ref 6.0–8.5)

## 2021-05-26 LAB — LIPID PANEL
Chol/HDL Ratio: 3.5 ratio (ref 0.0–5.0)
Cholesterol, Total: 131 mg/dL (ref 100–199)
HDL: 37 mg/dL — ABNORMAL LOW (ref >39)
LDL Chol Calc (NIH): 48 mg/dL (ref 0–99)
Triglycerides: 301 mg/dL — ABNORMAL HIGH (ref 0–149)
VLDL Cholesterol Cal: 46 mg/dL — ABNORMAL HIGH (ref 5–40)

## 2021-05-26 NOTE — Assessment & Plan Note (Signed)
History of hyperlipidemia intolerant to statin therapy on Repatha and Vascepa.  We will recheck a lipid liver profile this morning.  His last LDL performed 01/13/2021 was 127.

## 2021-05-26 NOTE — Assessment & Plan Note (Addendum)
Recent coronary calcium score performed 09/18/2020 was 1051 with disease primarily in the LAD and RCA.  He is totally asymptomatic.  A recent Myoview stress test performed subsequent to that on 03/22/2021 was normal.

## 2021-05-26 NOTE — Patient Instructions (Signed)

## 2021-05-26 NOTE — Progress Notes (Signed)
05/26/2021 John Donaldson   1956/03/26  [id number]  Primary Physician Prince Solian, MD Primary Cardiologist: Lorretta Harp MD FACP, [place], [place], [place]  HPI:  John Donaldson is a 66 y.o.   mildly overweight but otherwise fit appearing married Caucasian male who I last saw in the office 03/05/2021.  His wife John Donaldson today who works for [employer].  He was referred by Dr. Dagmar Hait, his PCP, because of an elevated coronary calcium score.  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  He now does as needed consulting on homicide cases.  History of factors include untreated hyperlipidemia because of statin intolerance and diabetes.  There is no family history for heart disease other than a father who had CAD in his mid 79s.  He is never had a heart attack or stroke.  He denies chest pain or shortness of breath.  He is very active and plays golf twice a week and works out 3-4 times a week as well without symptoms.  He had a coronary calcium score performed by Dr. Dagmar Hait  09/18/2020 which was 1051 with calcium primarily in the LAD and RCA although he is completely asymptomatic.  I performed Myoview stress testing on him 03/19/2021 which was entirely normal.  He is statin intolerant and he was recently begun on Repatha addition of Vascepa.  His last LDL performed 01/13/2021 was 127.   Current Meds  Medication Sig   cetirizine (ZYRTEC) 10 MG tablet Take 10 mg by mouth daily.   Empagliflozin-metFORMIN HCl ER (SYNJARDY XR) 25-1000 MG TB24 Take by mouth daily.   Empagliflozin-metFORMIN HCl ER (SYNJARDY XR) 25-1000 MG TB24 TAKE 1 TABLET BY MOUTH ONCE A DAY   Evolocumab (REPATHA SURECLICK) [id number] MG/ML SOAJ Inject 140 mg into the skin every 14 days.   fluticasone (FLONASE) 50 MCG/ACT nasal spray INHALE 2  SPRAYS IN EACH NOSTRIL ONCE A DAY   icosapent Ethyl (VASCEPA) 1 g capsule TAKE 2 CAPSULES BY MOUTH TWO TIMES DAILY     Allergies  Allergen Reactions   Crestor [Rosuvastatin]     myalgias   Lipitor [Atorvastatin]     myalgias    Social History   Socioeconomic History   Marital status: Married    Spouse name: Not on file   Number of children: Not on file   Years of education: Not on file   Highest education level: Not on file  Occupational History   Not on file  Tobacco Use   Smoking status: Former    Types: Cigarettes, Cigars   Smokeless tobacco: Former    Quit date: 05/16/1992  Substance and Sexual Activity   Alcohol use: Yes    Comment: rare   Drug use: No   Sexual activity: Not on file  Other Topics Concern   Not on file  Social History Narrative   Not on file   Social Determinants of Health   Financial Resource Strain: Not on file  Food Insecurity: Not on file  Transportation Needs: Not on file  Physical Activity: Not on file  Stress: Not on file  Social Connections: Not on file  Intimate Partner Violence: Not on file     Review of Systems: General: negative for chills, fever, night sweats or weight changes.  Cardiovascular:  negative for chest pain, dyspnea on exertion, edema, orthopnea, palpitations, paroxysmal nocturnal dyspnea or shortness of breath Dermatological: negative for rash Respiratory: negative for cough or wheezing Urologic: negative for hematuria Abdominal: negative for nausea, vomiting, diarrhea, bright red blood per rectum, melena, or hematemesis Neurologic: negative for visual changes, syncope, or dizziness All other systems reviewed and are otherwise negative except as noted above.    Blood pressure 128/80, pulse 68, resp. rate 20, height 6\' 5"  (1.956 m), weight 243 lb (110.2 kg), SpO2 98 %.  General appearance: alert and no distress Neck: no adenopathy, no carotid bruit, no JVD, supple, symmetrical, trachea midline, and thyroid not  enlarged, symmetric, no tenderness/mass/nodules Lungs: clear to auscultation bilaterally Heart: regular rate and rhythm, S1, S2 normal, no murmur, click, rub or gallop Extremities: extremities normal, atraumatic, no cyanosis or edema Pulses: 2+ and symmetric Skin: Skin color, texture, turgor normal. No rashes or lesions Neurologic: Grossly normal  EKG not performed today  ASSESSMENT AND PLAN:   Elevated coronary artery calcium score Recent coronary calcium score performed 09/18/2020 was 1051 with disease primarily in the LAD and RCA.  He is totally asymptomatic.  A recent Myoview stress test performed subsequent to that on 03/22/2021 was normal.  Hyperlipidemia History of hyperlipidemia intolerant to statin therapy on Repatha and Vascepa.  We will recheck a lipid liver profile this morning.  His last LDL performed 01/13/2021 was 127.     Lorretta Harp MD FACP,FACC,FAHA, [facility] 05/26/2021 10:01 AM

## 2021-06-02 ENCOUNTER — Other Ambulatory Visit (HOSPITAL_COMMUNITY): Payer: Self-pay

## 2021-06-02 MED ORDER — OZEMPIC (0.25 OR 0.5 MG/DOSE) 2 MG/1.5ML ~~LOC~~ SOPN
PEN_INJECTOR | SUBCUTANEOUS | 5 refills | Status: DC
  Filled 2021-06-02: qty 1.5, 28d supply, fill #0

## 2021-06-02 MED ORDER — TADALAFIL 2.5 MG PO TABS
ORAL_TABLET | ORAL | 5 refills | Status: DC
  Filled 2021-06-02: qty 30, 30d supply, fill #0
  Filled 2021-07-02: qty 30, 30d supply, fill #1
  Filled 2021-08-02: qty 30, 30d supply, fill #2
  Filled 2021-09-02: qty 30, 30d supply, fill #3
  Filled 2021-09-29: qty 30, 30d supply, fill #4
  Filled 2021-11-03: qty 30, 30d supply, fill #5

## 2021-06-03 ENCOUNTER — Other Ambulatory Visit (HOSPITAL_COMMUNITY): Payer: Self-pay

## 2021-06-14 ENCOUNTER — Other Ambulatory Visit (HOSPITAL_COMMUNITY): Payer: Self-pay

## 2021-06-28 ENCOUNTER — Other Ambulatory Visit (HOSPITAL_COMMUNITY): Payer: Self-pay

## 2021-06-28 MED ORDER — CEFDINIR 300 MG PO CAPS
ORAL_CAPSULE | ORAL | 0 refills | Status: DC
  Filled 2021-06-28: qty 14, 7d supply, fill #0

## 2021-07-02 ENCOUNTER — Other Ambulatory Visit (HOSPITAL_COMMUNITY): Payer: Self-pay

## 2021-07-02 MED ORDER — OZEMPIC (1 MG/DOSE) 4 MG/3ML ~~LOC~~ SOPN
PEN_INJECTOR | SUBCUTANEOUS | 2 refills | Status: DC
  Filled 2021-07-02: qty 3, 28d supply, fill #0

## 2021-07-05 ENCOUNTER — Other Ambulatory Visit (HOSPITAL_COMMUNITY): Payer: Self-pay

## 2021-07-08 ENCOUNTER — Other Ambulatory Visit (HOSPITAL_COMMUNITY): Payer: Self-pay

## 2021-07-09 ENCOUNTER — Other Ambulatory Visit (HOSPITAL_COMMUNITY): Payer: Self-pay

## 2021-07-30 ENCOUNTER — Other Ambulatory Visit (HOSPITAL_COMMUNITY): Payer: Self-pay

## 2021-08-02 ENCOUNTER — Other Ambulatory Visit (HOSPITAL_COMMUNITY): Payer: Self-pay

## 2021-08-04 ENCOUNTER — Other Ambulatory Visit (HOSPITAL_COMMUNITY): Payer: Self-pay

## 2021-08-05 ENCOUNTER — Telehealth: Payer: Self-pay | Admitting: Cardiovascular Disease

## 2021-08-05 ENCOUNTER — Other Ambulatory Visit (HOSPITAL_COMMUNITY): Payer: Self-pay

## 2021-08-05 NOTE — Telephone Encounter (Signed)
Patient called stating he needs a prior-auth for Repatha.   ?His insurance has change to [facility], Member ID# [id number], Group# [id number].  Phone number to call [phone].  ?

## 2021-08-05 NOTE — Telephone Encounter (Signed)
Insurance changed to state health plan which prefers praluent so I will have to ask dr. Delma Officer if switching to praluent is an option and if so which dose would he deem appropriate. I will route to chris pavero pharmd.  ?

## 2021-08-10 ENCOUNTER — Other Ambulatory Visit (HOSPITAL_COMMUNITY): Payer: Self-pay

## 2021-08-10 MED ORDER — PRALUENT 75 MG/ML ~~LOC~~ SOAJ
75.0000 mg | SUBCUTANEOUS | 11 refills | Status: DC
  Filled 2021-08-10: qty 2, fill #0

## 2021-08-10 MED ORDER — PRALUENT 75 MG/ML ~~LOC~~ SOAJ: 75.0000 mg | SUBCUTANEOUS | 11 refills | Status: DC

## 2021-08-10 NOTE — Telephone Encounter (Signed)
Called and spoke to pt and stated that they were approved for praluent 75, rx sent, pt instructed to complete copay card. Pt voiced understanding ? ?If you have questions about the MyPRALUENT Copay Card or you wish to discontinue your participation, please contact us at [phone], 24 hours a day, 7 days a week. ?

## 2021-08-10 NOTE — Addendum Note (Signed)
Addended by: Eather Colas on: 08/10/2021 01:40 PM ? ? Modules accepted: Orders ? ?

## 2021-08-10 NOTE — Telephone Encounter (Signed)
Yes, ok to change to Praluent 75mg  ?

## 2021-08-10 NOTE — Telephone Encounter (Signed)
Lmom the pt that insurance requires praluent and that a pa was submitted. ?Leovardo Jaycox ([employer]th) - [id number] ?Praluent 75MG /ML auto-injectors ?Status: PA Request ?Created: March 28th, 2023 ?Sent: March 28th, 2023 ?

## 2021-08-10 NOTE — Telephone Encounter (Signed)
Pa submitted for praluent 75mg : ?John Donaldson (John Donaldson) [id number] ?Praluent 75MG /ML auto-injectors ?Status: PA Request ?Created: March 28th, 2023 ?Sent: March 28th, 2023 ? ?Will contact the pt once determination is made to discuss switching from repatha to praluent ?

## 2021-09-02 ENCOUNTER — Other Ambulatory Visit (HOSPITAL_COMMUNITY): Payer: Self-pay

## 2021-09-03 ENCOUNTER — Other Ambulatory Visit (HOSPITAL_COMMUNITY): Payer: Self-pay

## 2021-09-20 ENCOUNTER — Telehealth: Payer: Self-pay | Admitting: Cardiovascular Disease

## 2021-09-20 NOTE — Telephone Encounter (Signed)
?  Pt c/o medication issue: ? ?1. Name of Medication:  ? ?Alirocumab (PRALUENT) 75 MG/ML SOAJ ? ?2. How are you currently taking this medication (dosage and times per day)? As directed  ? ?3. Are you having a reaction (difficulty breathing--STAT)?  Sore muscles, blood sugar spikes ? ?4. What is your medication issue? Patient was on Repatha until he got a new insurance plan that wanted him to take Praluent. He states he had to get it filled at least once and now he wants to see if he can go back to the the Repatha since he is having some effects from the Praluent.  ?

## 2021-09-20 NOTE — Telephone Encounter (Signed)
Contacted patient, he states he has been on Repatha before, but states because of insurance changes he had to go to [employer]. He states he has had a few doses and since starting it he has noticed his A1C levels increasing and an increase in having sore muscles and  more pains. He is questioning if we can try to get him back on Repatha. I advised I would route to MD to give okay, and then would route to pharmacy team to assist in getting the paperwork completed.  ? ?Thanks!  ? ?

## 2021-09-21 NOTE — Telephone Encounter (Signed)
Pa started for repatha. Awaiting questions.  ?

## 2021-09-21 NOTE — Telephone Encounter (Signed)
John Donaldson (KeyDelanna Notice) - [id number] ?Repatha SureClick 140MG /ML auto-injectors ?Status: PA Request ?Created: May 9th, 2023 ?Sent: May 9th, 2023 ?

## 2021-09-21 NOTE — Telephone Encounter (Signed)
Please complete PA for Repatha 

## 2021-09-29 ENCOUNTER — Other Ambulatory Visit (HOSPITAL_COMMUNITY): Payer: Self-pay

## 2021-09-30 ENCOUNTER — Other Ambulatory Visit (HOSPITAL_COMMUNITY): Payer: Self-pay

## 2021-10-06 NOTE — Telephone Encounter (Signed)
Received fax stating they needed more information such as an letter of medical necessity. So I handed the paper work to dr. Delma Officer to write the letter. I will route this to him

## 2021-10-07 ENCOUNTER — Other Ambulatory Visit: Payer: Self-pay

## 2021-10-07 MED ORDER — REPATHA SURECLICK 140 MG/ML ~~LOC~~ SOAJ: 140.0000 mg | SUBCUTANEOUS | 0 refills | Status: DC

## 2021-10-07 NOTE — Telephone Encounter (Signed)
Provided the pt 2 samples of repatha to get them thru the month of June. And reached out to the pt per dr. Nance Pear verbal instruction.  SENT THE PT A MYCHART MSG STATING: John Donaldson,  We have tried to get the repatha approved for you and have had no such luck. Your insurance has chosen praluent to be their "drug of choice" (preferred) until 11/13/21. After that they will be switching to repatha. I have taken the liberty to prepare you samples to last you for the month of June until we can get the repatha from your insurance. Truth is we can appeal the determination but it takes 4 weeks typically to hear back so we might as well wait it out until the repatha will be preferred. The samples will be located at our [facility] office. Please let us know should you need anything else. Thanks,  John Donaldson RMA [facility] (CHOLESTEROL) [phone]

## 2021-10-18 ENCOUNTER — Telehealth: Payer: Self-pay | Admitting: Cardiovascular Disease

## 2021-10-18 NOTE — Telephone Encounter (Signed)
*  STAT* If patient is at the pharmacy, call can be transferred to refill team.   1. Which medications need to be refilled? (please list name of each medication and dose if known)   Evolocumab (REPATHA SURECLICK) 140 MG/ML SOAJ    2. Which pharmacy/location (including street and city if local pharmacy) is medication to be sent to?  [id number] - [place], [place] - [address]. Do they need a 30 day or 90 day supply?  30 day

## 2021-10-19 ENCOUNTER — Other Ambulatory Visit: Payer: Self-pay | Admitting: Cardiovascular Disease

## 2021-10-19 MED ORDER — REPATHA SURECLICK 140 MG/ML ~~LOC~~ SOAJ: 140.0000 mg | SUBCUTANEOUS | 11 refills | Status: DC

## 2021-10-19 NOTE — Telephone Encounter (Signed)
Refill sent as requested. 

## 2021-10-21 ENCOUNTER — Other Ambulatory Visit: Payer: Self-pay

## 2021-10-21 MED ORDER — REPATHA SURECLICK 140 MG/ML ~~LOC~~ SOAJ: 140.0000 mg | SUBCUTANEOUS | 11 refills | Status: DC

## 2021-10-21 NOTE — Addendum Note (Signed)
Addended by: Tiffony Kite E on: 10/21/2021 04:12 PM   Modules accepted: Orders

## 2021-10-21 NOTE — Telephone Encounter (Signed)
Repatha was sent 2 days ago and pharmacy should have it: E-Prescribing Status: Receipt confirmed by pharmacy (10/19/2021  6:59 AM EDT)  Will resend again but the error was likely on the pharmacy side as rx was sent in correctly and received by the pharmacy 2 days ago.

## 2021-10-21 NOTE — Telephone Encounter (Signed)
Patient is returning call and said that pharmacy is saying that the have not received the refill yet

## 2021-10-29 ENCOUNTER — Telehealth: Payer: Self-pay | Admitting: Cardiovascular Disease

## 2021-10-29 ENCOUNTER — Other Ambulatory Visit: Payer: Self-pay | Admitting: Cardiovascular Disease

## 2021-10-29 NOTE — Telephone Encounter (Signed)
Pt c/o medication issue:  1. Name of Medication:   Evolocumab (REPATHA SURECLICK) 140 MG/ML SOAJ    2. How are you currently taking this medication (dosage and times per day)?  Inject 140 mg into the skin every 14 (fourteen) days. 3. Are you having a reaction (difficulty breathing--STAT)? No  4. What is your medication issue? Pt states that insurance will not approve medication. Pt is wanting to know if he is able to go back to [employer] since insurance will cover. Pt is completely out of medication and needs as soon possible. Please advise

## 2021-10-29 NOTE — Telephone Encounter (Signed)
Spoke with pt regarding issues with getting repatha cover by his insurance. Per C[facility], CMA's note, praluent is the "drug of choice" for his insurance until 11/13/21, at that time they are switching back to repatha as their "drug of choice." Pt also states that he prefers repatha over praluent due to less side effects. Explained to pt that we had samples for him that will get him through 11/13/21 and at that time we can send in a new prescription for repatha that should be covered by his insurance. Pt will pick up samples later today. Pt verbalizes understanding.

## 2021-11-03 ENCOUNTER — Other Ambulatory Visit (HOSPITAL_COMMUNITY): Payer: Self-pay

## 2021-11-04 ENCOUNTER — Other Ambulatory Visit (HOSPITAL_COMMUNITY): Payer: Self-pay

## 2021-11-05 ENCOUNTER — Other Ambulatory Visit (HOSPITAL_COMMUNITY): Payer: Self-pay

## 2021-11-17 ENCOUNTER — Other Ambulatory Visit: Payer: Self-pay | Admitting: Cardiovascular Disease

## 2021-11-17 NOTE — Telephone Encounter (Signed)
*  STAT* If patient is at the pharmacy, call can be transferred to refill team.   1. Which medications need to be refilled? (please list name of each medication and dose if known) Evolocumab (REPATHA SURECLICK) 140 MG/ML SOAJ  2. Which pharmacy/location (including street and city if local pharmacy) is medication to be sent to? [id number] - [place], [place] - [address]  3. Do they need a 30 day or 90 day supply? 90

## 2021-11-18 ENCOUNTER — Other Ambulatory Visit: Payer: Self-pay | Admitting: Pharmacist

## 2021-11-18 ENCOUNTER — Telehealth: Payer: Self-pay | Admitting: Pharmacist

## 2021-11-18 ENCOUNTER — Other Ambulatory Visit (HOSPITAL_COMMUNITY): Payer: Self-pay

## 2021-11-18 MED ORDER — REPATHA SURECLICK 140 MG/ML ~~LOC~~ SOAJ: 140.0000 mg | SUBCUTANEOUS | 3 refills | Status: DC

## 2021-11-18 MED ORDER — REPATHA SURECLICK 140 MG/ML ~~LOC~~ SOAJ
140.0000 mg | SUBCUTANEOUS | 11 refills | Status: DC
  Filled 2021-11-18: qty 2, 28d supply, fill #0

## 2021-11-18 NOTE — Telephone Encounter (Signed)
Pt's formulary changed from Praluent to Repatha. Repatha available on his plan without prior authorization. He had previously taken Repatha and tolerated it better than Praluent. Pt aware of med change, rx sent to pharmacy. [facility]         Pt will need to call Repatha Ready to activate copay card at [phone], likely already had one in the past when he took Repatha. He was appreciative for the call.

## 2021-11-20 ENCOUNTER — Other Ambulatory Visit (HOSPITAL_COMMUNITY): Payer: Self-pay

## 2021-11-29 ENCOUNTER — Other Ambulatory Visit (HOSPITAL_COMMUNITY): Payer: Self-pay

## 2021-11-29 MED ORDER — TADALAFIL 2.5 MG PO TABS
2.5000 mg | ORAL_TABLET | Freq: Every day | ORAL | 5 refills | Status: DC
  Filled 2021-11-29: qty 30, 30d supply, fill #0
  Filled 2022-01-02: qty 30, 30d supply, fill #1
  Filled 2022-02-02: qty 30, 30d supply, fill #2
  Filled 2022-03-04: qty 30, 30d supply, fill #3
  Filled 2022-04-04: qty 30, 30d supply, fill #4
  Filled 2022-05-04: qty 30, 30d supply, fill #5

## 2021-11-30 ENCOUNTER — Other Ambulatory Visit (HOSPITAL_COMMUNITY): Payer: Self-pay

## 2022-01-03 ENCOUNTER — Other Ambulatory Visit (HOSPITAL_COMMUNITY): Payer: Self-pay

## 2022-01-04 ENCOUNTER — Other Ambulatory Visit (HOSPITAL_COMMUNITY): Payer: Self-pay

## 2022-01-06 ENCOUNTER — Other Ambulatory Visit (HOSPITAL_COMMUNITY): Payer: Self-pay

## 2022-01-28 ENCOUNTER — Other Ambulatory Visit (HOSPITAL_COMMUNITY): Payer: Self-pay

## 2022-01-28 MED ORDER — OZEMPIC (1 MG/DOSE) 4 MG/3ML ~~LOC~~ SOPN
PEN_INJECTOR | SUBCUTANEOUS | 6 refills | Status: DC
  Filled 2022-01-28: qty 3, 28d supply, fill #0
  Filled 2022-02-21: qty 3, 28d supply, fill #1
  Filled 2022-03-21: qty 3, 28d supply, fill #2

## 2022-02-02 ENCOUNTER — Other Ambulatory Visit (HOSPITAL_COMMUNITY): Payer: Self-pay

## 2022-02-04 ENCOUNTER — Other Ambulatory Visit (HOSPITAL_COMMUNITY): Payer: Self-pay

## 2022-02-21 ENCOUNTER — Other Ambulatory Visit (HOSPITAL_COMMUNITY): Payer: Self-pay

## 2022-03-04 ENCOUNTER — Other Ambulatory Visit (HOSPITAL_COMMUNITY): Payer: Self-pay

## 2022-03-21 ENCOUNTER — Other Ambulatory Visit (HOSPITAL_COMMUNITY): Payer: Self-pay

## 2022-04-04 ENCOUNTER — Other Ambulatory Visit (HOSPITAL_COMMUNITY): Payer: Self-pay

## 2022-04-06 ENCOUNTER — Other Ambulatory Visit (HOSPITAL_COMMUNITY): Payer: Self-pay

## 2022-04-06 ENCOUNTER — Other Ambulatory Visit (HOSPITAL_BASED_OUTPATIENT_CLINIC_OR_DEPARTMENT_OTHER): Payer: Self-pay

## 2022-04-08 ENCOUNTER — Other Ambulatory Visit (HOSPITAL_COMMUNITY): Payer: Self-pay

## 2022-04-18 ENCOUNTER — Other Ambulatory Visit (HOSPITAL_COMMUNITY): Payer: Self-pay

## 2022-04-18 MED ORDER — OZEMPIC (1 MG/DOSE) 4 MG/3ML ~~LOC~~ SOPN
1.0000 mg | PEN_INJECTOR | SUBCUTANEOUS | 6 refills | Status: DC
  Filled 2022-04-18: qty 3, 28d supply, fill #0
  Filled 2022-05-15: qty 3, 28d supply, fill #1
  Filled 2022-06-10: qty 3, 28d supply, fill #2
  Filled 2022-07-08: qty 3, 28d supply, fill #3
  Filled 2022-08-04: qty 3, 28d supply, fill #4
  Filled 2022-08-31: qty 3, 28d supply, fill #5
  Filled 2022-10-03: qty 3, 28d supply, fill #6

## 2022-04-19 ENCOUNTER — Other Ambulatory Visit (HOSPITAL_COMMUNITY): Payer: Self-pay

## 2022-05-03 ENCOUNTER — Other Ambulatory Visit (HOSPITAL_COMMUNITY): Payer: Self-pay

## 2022-05-05 ENCOUNTER — Other Ambulatory Visit (HOSPITAL_COMMUNITY): Payer: Self-pay

## 2022-06-01 ENCOUNTER — Other Ambulatory Visit (HOSPITAL_COMMUNITY): Payer: Self-pay

## 2022-06-02 ENCOUNTER — Other Ambulatory Visit: Payer: Self-pay

## 2022-06-02 ENCOUNTER — Other Ambulatory Visit (HOSPITAL_COMMUNITY): Payer: Self-pay

## 2022-06-02 MED ORDER — TADALAFIL 2.5 MG PO TABS
2.5000 mg | ORAL_TABLET | Freq: Every day | ORAL | 5 refills | Status: DC
  Filled 2022-06-02: qty 30, 30d supply, fill #0
  Filled 2022-07-03: qty 30, 30d supply, fill #1
  Filled 2022-08-01: qty 30, 30d supply, fill #2
  Filled 2022-08-31: qty 30, 30d supply, fill #3
  Filled 2022-10-03: qty 30, 30d supply, fill #4
  Filled 2022-10-30: qty 30, 30d supply, fill #5

## 2022-06-03 ENCOUNTER — Other Ambulatory Visit (HOSPITAL_COMMUNITY): Payer: Self-pay

## 2022-06-03 MED ORDER — FLUTICASONE PROPIONATE 50 MCG/ACT NA SUSP
2.0000 | Freq: Every day | NASAL | 3 refills | Status: AC
  Filled 2022-06-03: qty 48, 90d supply, fill #0
  Filled 2022-10-30: qty 48, 90d supply, fill #1

## 2022-06-10 ENCOUNTER — Other Ambulatory Visit: Payer: Self-pay

## 2022-06-13 ENCOUNTER — Other Ambulatory Visit: Payer: Self-pay

## 2022-07-04 ENCOUNTER — Other Ambulatory Visit: Payer: Self-pay

## 2022-07-05 ENCOUNTER — Other Ambulatory Visit (HOSPITAL_COMMUNITY): Payer: Self-pay

## 2022-07-08 ENCOUNTER — Other Ambulatory Visit: Payer: Self-pay

## 2022-08-01 ENCOUNTER — Other Ambulatory Visit (HOSPITAL_COMMUNITY): Payer: Self-pay

## 2022-08-04 ENCOUNTER — Other Ambulatory Visit: Payer: Self-pay

## 2022-08-31 ENCOUNTER — Other Ambulatory Visit: Payer: Self-pay

## 2022-10-03 ENCOUNTER — Other Ambulatory Visit (HOSPITAL_COMMUNITY): Payer: Self-pay

## 2022-10-04 ENCOUNTER — Other Ambulatory Visit: Payer: Self-pay

## 2022-10-06 IMAGING — CT CT CARDIAC CORONARY ARTERY CALCIUM SCORE
3 series · 14 of 20 positions shown, 16 images · non-contrast
Comparison: None.

CLINICAL DATA: 62-year-old Caucasian male with elevated
cholesterol.

EXAM:
CT CARDIAC CORONARY ARTERY CALCIUM SCORE
TECHNIQUE: Non-contrast imaging through the heart was performed using
prospective ECG gating. Image post processing was performed on an
independent workstation, allowing for quantitative analysis of the
heart and coronary arteries. Note that this exam targets the heart
and the chest was not imaged in its entirety.

[Series 2: calcium scoring 2.00 qr36 bestdiast 70% hrt calciu · axial · 0.41mm/px · z∈[+1782,+1866]mm · 4 of 70 slices shown]
[im 14/70  vessel]
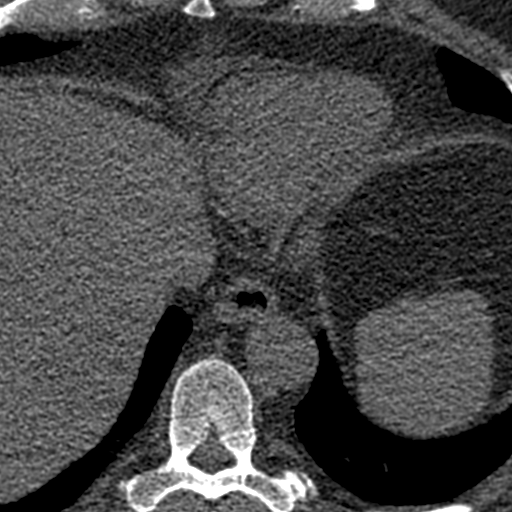
[im 28/70  vessel]
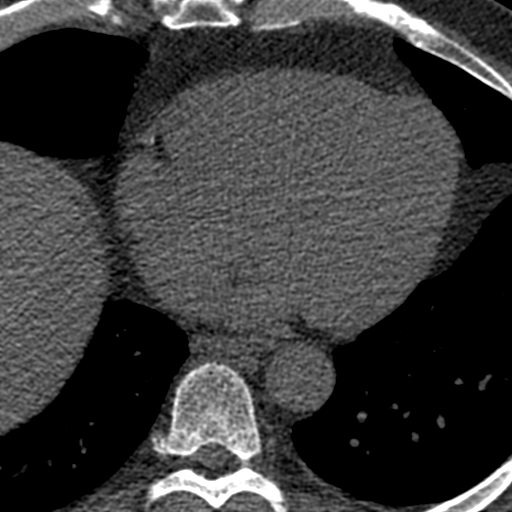
[im 42/70  vessel]
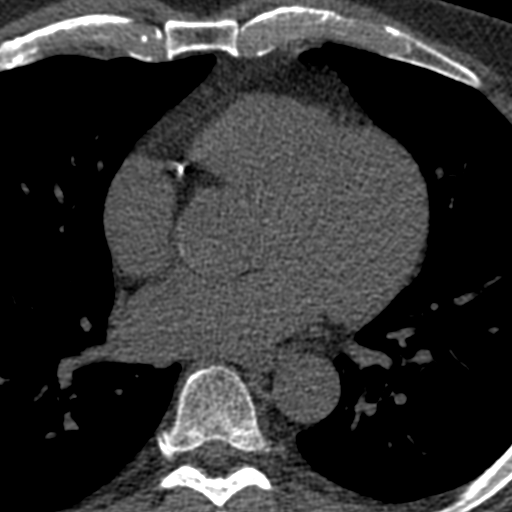
[im 56/70  vessel]
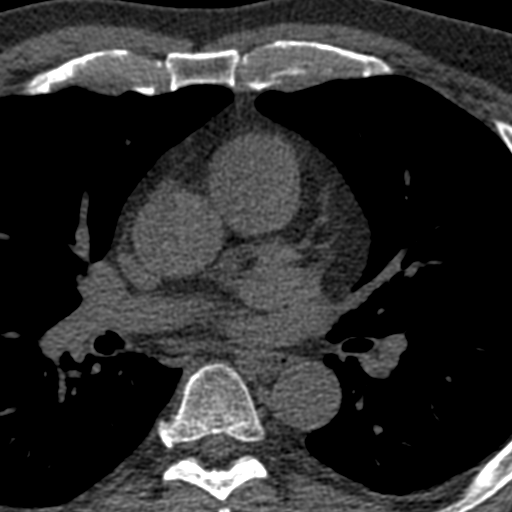

[Series 3: calcium scoring 2.00 br40 bestdiast 70% axial · axial · 0.63mm/px · z∈[+1778,+1870]mm · 5 of 70 slices shown, 7 images]
[im 12/70  vessel]
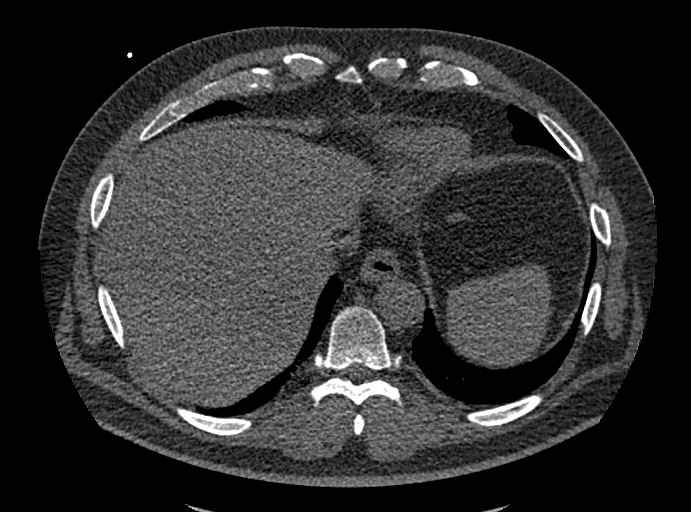
[im 12/70  lung]
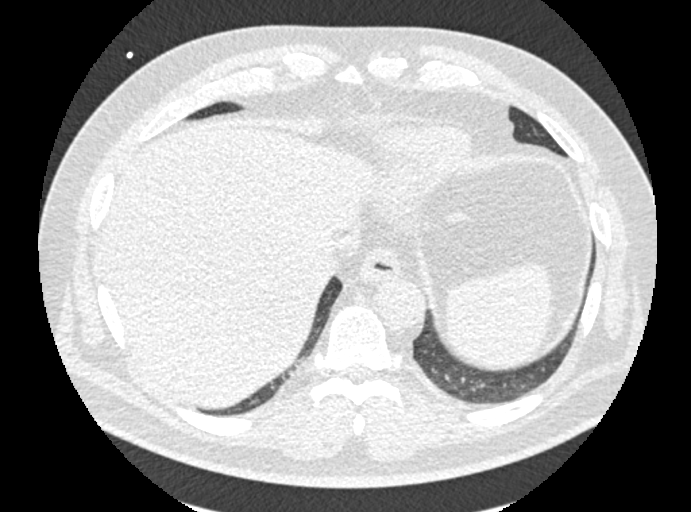
[im 24/70  vessel]
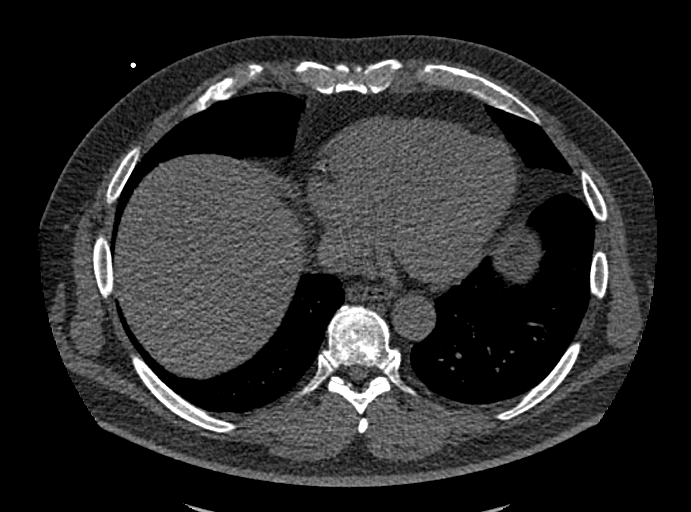
[im 35/70  vessel]
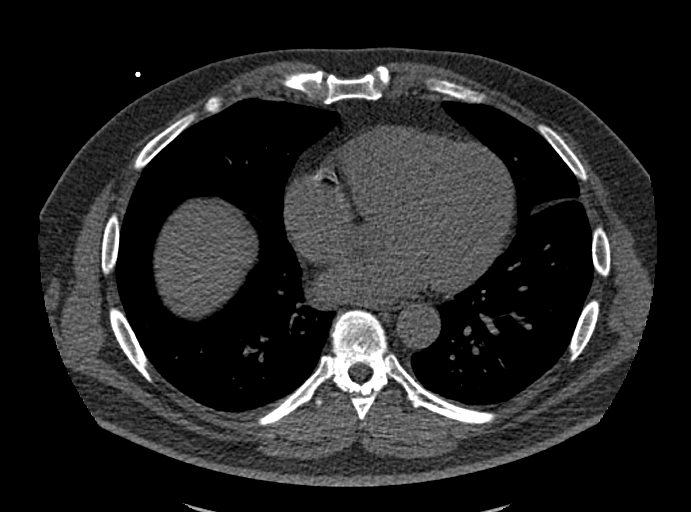
[im 47/70  vessel]
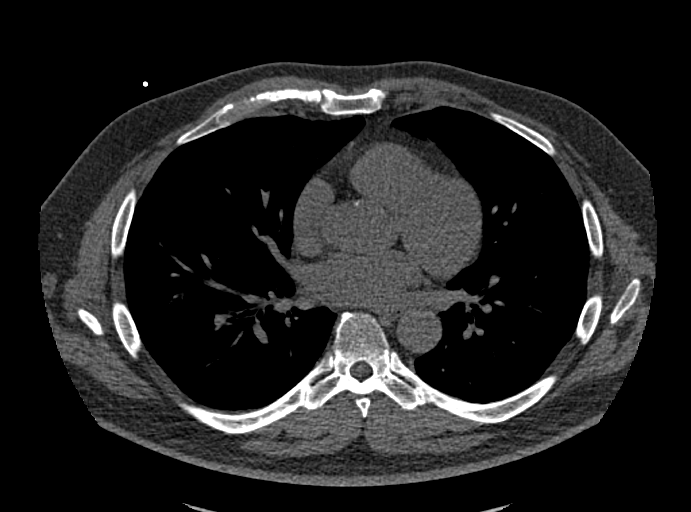
[im 58/70  vessel]
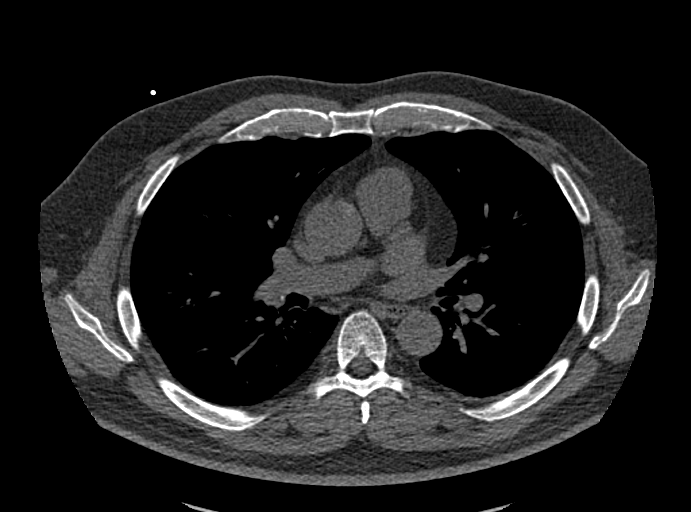
[im 58/70  lung]
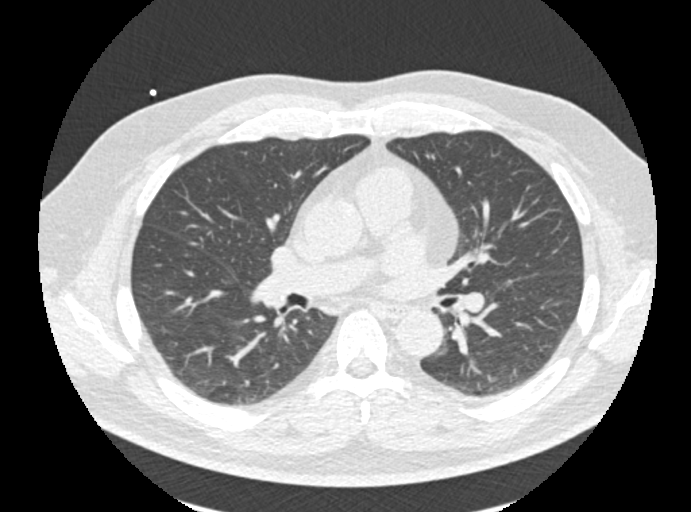

[Series 9: calcium scoring 2.00 br60 bestdiast 70% lungs · axial · 0.63mm/px · z∈[+1778,+1870]mm · 5 of 70 slices shown]
[im 12/70  vessel]
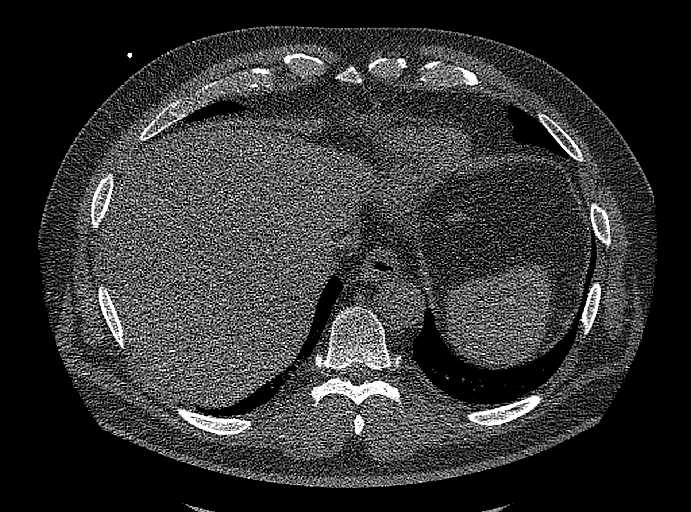
[im 24/70  vessel]
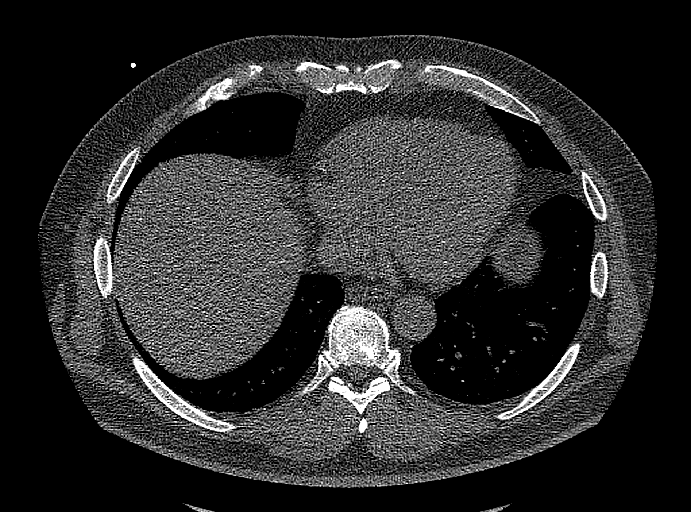
[im 35/70  vessel]
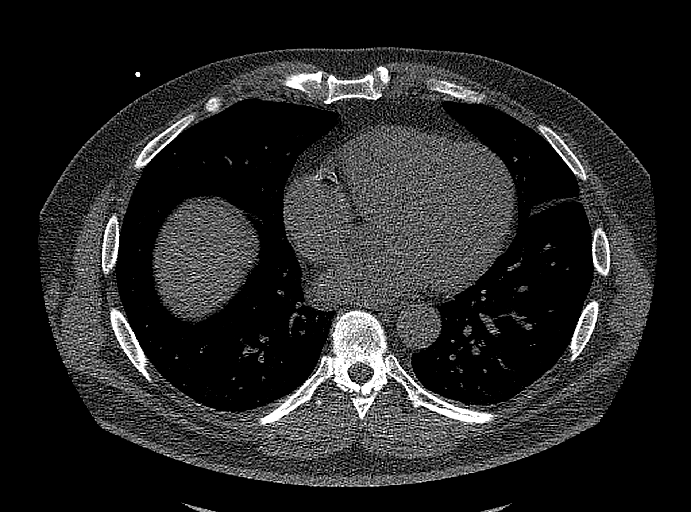
[im 47/70  vessel]
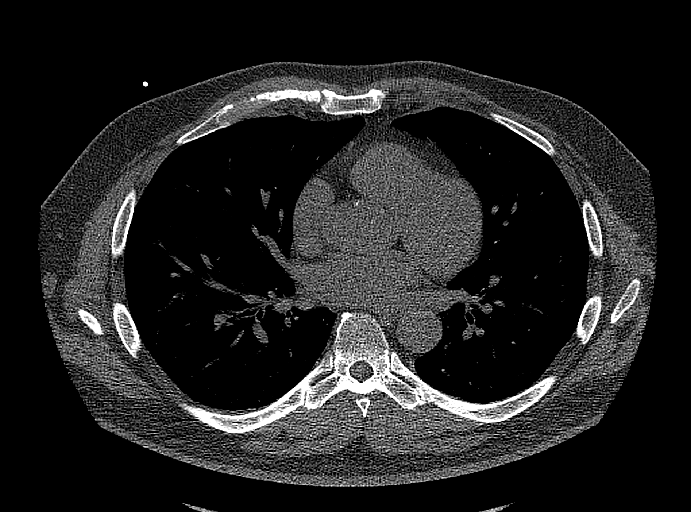
[im 58/70  vessel]
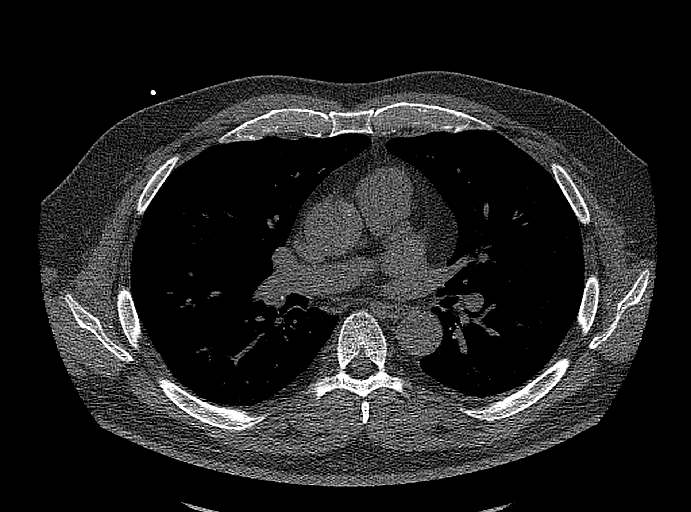

[14 of 20 positions shown; findings below may reference images not displayed]

FINDINGS: Technical quality: Good

CORONARY CALCIUM SCORES:

Left Main: 129

LAD: 329

LCx: 77

RCA: 517

CORONARY CALCIUM

Total Agatston Score: 6286

[HOSPITAL] percentile: 95

Ascending aorta (normal <  40 mm): 36 mm

EXTRACARDIAC FINDINGS:

Limited view of the lung parenchyma demonstrates no suspicious
nodularity. Airways are normal.

Limited view of the mediastinum demonstrates no adenopathy.
Esophagus normal.

Limited view of the upper abdomen is unremarkable.

Limited view of the skeleton and chest wall is unremarkable.
IMPRESSION: 1. Three-vessel coronary artery calcification.

2. Total Agatston Score: 151

3. MESA age and sex matched database percentile: 95

## 2022-10-23 ENCOUNTER — Other Ambulatory Visit: Payer: Self-pay | Admitting: Cardiovascular Disease

## 2022-10-23 DIAGNOSIS — E782 Mixed hyperlipidemia: Secondary | ICD-10-CM

## 2022-10-23 DIAGNOSIS — R931 Abnormal findings on diagnostic imaging of heart and coronary circulation: Secondary | ICD-10-CM

## 2022-10-23 DIAGNOSIS — E785 Hyperlipidemia, unspecified: Secondary | ICD-10-CM

## 2022-10-31 ENCOUNTER — Other Ambulatory Visit (HOSPITAL_COMMUNITY): Payer: Self-pay

## 2022-11-01 ENCOUNTER — Telehealth: Payer: Self-pay | Admitting: Cardiovascular Disease

## 2022-11-01 ENCOUNTER — Telehealth: Payer: Self-pay

## 2022-11-01 ENCOUNTER — Other Ambulatory Visit (HOSPITAL_COMMUNITY): Payer: Self-pay

## 2022-11-01 DIAGNOSIS — E782 Mixed hyperlipidemia: Secondary | ICD-10-CM

## 2022-11-01 DIAGNOSIS — E785 Hyperlipidemia, unspecified: Secondary | ICD-10-CM

## 2022-11-01 DIAGNOSIS — R931 Abnormal findings on diagnostic imaging of heart and coronary circulation: Secondary | ICD-10-CM

## 2022-11-01 MED ORDER — REPATHA SURECLICK 140 MG/ML ~~LOC~~ SOAJ: 140.0000 mg | SUBCUTANEOUS | 11 refills | Status: DC

## 2022-11-01 NOTE — Telephone Encounter (Signed)
PA initiated, please see separate encounter for updates on determination. (I will route you back in once a decision has been made)  Oluwatobi Ruppe, CPhT Pharmacy Patient Advocate Specialist Direct Number: [phone] Fax: [phone]  

## 2022-11-01 NOTE — Telephone Encounter (Signed)
Prior John Donaldson has been approved, pt's pharmacy should be able to run his Repatha rx now. I have sent in a refill as well.

## 2022-11-01 NOTE — Telephone Encounter (Signed)
Pt c/o medication issue:  1. Name of Medication:  Evolocumab (REPATHA SURECLICK) 140 MG/ML SOAJ  2. How are you currently taking this medication (dosage and times per day)?   3. Are you having a reaction (difficulty breathing--STAT)?   4. What is your medication issue?   [facility] needs an [facility] and [facility] Patient states he is past due for his injection as well.  [id number] [place], [place] - [address]

## 2022-11-01 NOTE — Telephone Encounter (Signed)
Pharmacy Patient Advocate Encounter   Received notification from [facility] that prior authorization for REPATHA 140 MG/ML INJ is needed.    PA submitted on 11/01/22 EXPEDITED Key [id number] Status is pending  Haze Rushing, CPhT Pharmacy Patient Advocate Specialist Direct Number: [phone] Fax: [phone]

## 2022-11-01 NOTE — Telephone Encounter (Signed)
Pharmacy Patient Advocate Encounter  Prior Authorization for REPATHA 140 MG/ML INJ has been approved.    Effective dates: 11/01/22 through 11/01/23  Haze Rushing, CPhT Pharmacy Patient Advocate Specialist Direct Number: [phone] Fax: [phone]

## 2022-11-01 NOTE — Telephone Encounter (Signed)
Advised patient of information and he can check with pharmacy for pick up.  He states understanding

## 2022-11-01 NOTE — Telephone Encounter (Signed)
Can you advise if PA needed?

## 2022-11-07 ENCOUNTER — Other Ambulatory Visit (HOSPITAL_COMMUNITY): Payer: Self-pay

## 2022-11-08 ENCOUNTER — Other Ambulatory Visit (HOSPITAL_COMMUNITY): Payer: Self-pay

## 2022-11-09 ENCOUNTER — Other Ambulatory Visit (HOSPITAL_COMMUNITY): Payer: Self-pay

## 2022-11-09 MED ORDER — OZEMPIC (1 MG/DOSE) 4 MG/3ML ~~LOC~~ SOPN
1.0000 mg | PEN_INJECTOR | SUBCUTANEOUS | 6 refills | Status: DC
  Filled 2022-11-09 (×2): qty 3, 28d supply, fill #0
  Filled 2022-12-02 – 2022-12-16 (×3): qty 3, 28d supply, fill #1

## 2022-12-02 ENCOUNTER — Other Ambulatory Visit (HOSPITAL_COMMUNITY): Payer: Self-pay

## 2022-12-02 ENCOUNTER — Other Ambulatory Visit: Payer: Self-pay

## 2022-12-02 MED ORDER — TADALAFIL 2.5 MG PO TABS
2.5000 mg | ORAL_TABLET | ORAL | 5 refills | Status: DC
  Filled 2022-12-02: qty 30, 30d supply, fill #0
  Filled 2023-01-01: qty 30, 30d supply, fill #1
  Filled 2023-01-31 – 2023-02-01 (×4): qty 30, 30d supply, fill #2
  Filled 2023-03-06: qty 30, 30d supply, fill #3
  Filled 2023-04-07: qty 30, 30d supply, fill #4
  Filled 2023-05-05 – 2023-05-08 (×2): qty 30, 30d supply, fill #5

## 2022-12-12 ENCOUNTER — Other Ambulatory Visit (HOSPITAL_COMMUNITY): Payer: Self-pay

## 2022-12-13 ENCOUNTER — Other Ambulatory Visit (HOSPITAL_COMMUNITY): Payer: Self-pay

## 2022-12-15 ENCOUNTER — Other Ambulatory Visit (HOSPITAL_COMMUNITY): Payer: Self-pay

## 2022-12-16 ENCOUNTER — Other Ambulatory Visit: Payer: Self-pay

## 2022-12-16 ENCOUNTER — Other Ambulatory Visit (HOSPITAL_COMMUNITY): Payer: Self-pay

## 2022-12-16 MED ORDER — OZEMPIC (1 MG/DOSE) 4 MG/3ML ~~LOC~~ SOPN
1.0000 mg | PEN_INJECTOR | SUBCUTANEOUS | 6 refills | Status: DC
  Filled 2022-12-16: qty 3, 28d supply, fill #0
  Filled 2023-01-01: qty 3, fill #0
  Filled 2023-01-04 – 2023-01-09 (×2): qty 3, 28d supply, fill #0
  Filled 2023-02-14 (×2): qty 3, 28d supply, fill #1
  Filled 2023-03-28: qty 3, 28d supply, fill #2
  Filled 2023-04-25: qty 3, 28d supply, fill #3
  Filled 2023-05-24: qty 3, 28d supply, fill #4
  Filled 2023-07-03: qty 3, 28d supply, fill #5
  Filled 2023-07-31 (×2): qty 3, 28d supply, fill #6

## 2022-12-24 ENCOUNTER — Other Ambulatory Visit (HOSPITAL_COMMUNITY): Payer: Self-pay

## 2023-01-02 ENCOUNTER — Other Ambulatory Visit: Payer: Self-pay

## 2023-01-04 ENCOUNTER — Other Ambulatory Visit: Payer: Self-pay

## 2023-01-04 ENCOUNTER — Other Ambulatory Visit (HOSPITAL_COMMUNITY): Payer: Self-pay

## 2023-01-04 ENCOUNTER — Other Ambulatory Visit: Payer: Self-pay | Admitting: Cardiovascular Disease

## 2023-01-04 MED ORDER — REPATHA SURECLICK 140 MG/ML ~~LOC~~ SOAJ
140.0000 mg | SUBCUTANEOUS | 3 refills | Status: DC
  Filled 2023-01-04: qty 2, 28d supply, fill #0
  Filled 2023-01-31 – 2023-02-01 (×3): qty 2, 28d supply, fill #1
  Filled 2023-03-06: qty 2, 28d supply, fill #2
  Filled 2023-04-07: qty 2, 28d supply, fill #3
  Filled 2023-04-28: qty 2, 28d supply, fill #4
  Filled 2023-06-06: qty 2, 28d supply, fill #5
  Filled 2023-07-03: qty 2, 28d supply, fill #6
  Filled 2023-07-31: qty 2, 28d supply, fill #7
  Filled 2023-08-28: qty 2, 28d supply, fill #8
  Filled 2023-09-21: qty 2, 28d supply, fill #9
  Filled 2023-10-19: qty 2, 28d supply, fill #10
  Filled 2023-11-13: qty 2, 28d supply, fill #11

## 2023-01-05 ENCOUNTER — Other Ambulatory Visit: Payer: Self-pay

## 2023-01-09 ENCOUNTER — Other Ambulatory Visit (HOSPITAL_BASED_OUTPATIENT_CLINIC_OR_DEPARTMENT_OTHER): Payer: Self-pay

## 2023-01-09 ENCOUNTER — Other Ambulatory Visit (HOSPITAL_COMMUNITY): Payer: Self-pay

## 2023-01-11 ENCOUNTER — Other Ambulatory Visit (HOSPITAL_COMMUNITY): Payer: Self-pay

## 2023-01-11 ENCOUNTER — Other Ambulatory Visit: Payer: Self-pay

## 2023-01-11 MED ORDER — SYNJARDY XR 25-1000 MG PO TB24
1.0000 | ORAL_TABLET | Freq: Every day | ORAL | 3 refills | Status: DC
  Filled 2023-01-11: qty 90, 90d supply, fill #0
  Filled 2023-04-07: qty 90, 90d supply, fill #1
  Filled 2023-07-03: qty 90, 90d supply, fill #2
  Filled 2023-10-05: qty 90, 90d supply, fill #3

## 2023-01-11 MED ORDER — ICOSAPENT ETHYL 1 G PO CAPS
2.0000 g | ORAL_CAPSULE | Freq: Two times a day (BID) | ORAL | 3 refills | Status: DC
  Filled 2023-01-11: qty 360, 90d supply, fill #0
  Filled 2023-04-25 – 2023-05-18 (×2): qty 360, 90d supply, fill #1
  Filled 2023-08-12: qty 360, 90d supply, fill #2
  Filled 2023-11-10: qty 360, 90d supply, fill #3

## 2023-01-31 ENCOUNTER — Other Ambulatory Visit: Payer: Self-pay

## 2023-01-31 ENCOUNTER — Other Ambulatory Visit (HOSPITAL_COMMUNITY): Payer: Self-pay

## 2023-01-31 ENCOUNTER — Encounter (HOSPITAL_COMMUNITY): Payer: Self-pay

## 2023-02-01 ENCOUNTER — Other Ambulatory Visit: Payer: Self-pay

## 2023-02-01 ENCOUNTER — Other Ambulatory Visit (HOSPITAL_COMMUNITY): Payer: Self-pay

## 2023-02-02 ENCOUNTER — Other Ambulatory Visit (HOSPITAL_COMMUNITY): Payer: Self-pay

## 2023-02-14 ENCOUNTER — Other Ambulatory Visit (HOSPITAL_COMMUNITY): Payer: Self-pay

## 2023-02-14 ENCOUNTER — Other Ambulatory Visit: Payer: Self-pay

## 2023-03-01 ENCOUNTER — Telehealth: Payer: Self-pay

## 2023-03-01 ENCOUNTER — Other Ambulatory Visit (HOSPITAL_COMMUNITY): Payer: Self-pay

## 2023-03-01 ENCOUNTER — Encounter: Payer: Self-pay | Admitting: Cardiovascular Disease

## 2023-03-01 ENCOUNTER — Telehealth: Payer: Self-pay | Admitting: Pharmacy Technician

## 2023-03-01 ENCOUNTER — Ambulatory Visit: Payer: Medicare Other | Attending: Cardiovascular Disease | Admitting: Cardiovascular Disease

## 2023-03-01 VITALS — BP 130/84 | HR 70 | Ht 77.0 in | Wt 237.4 lb

## 2023-03-01 DIAGNOSIS — R931 Abnormal findings on diagnostic imaging of heart and coronary circulation: Secondary | ICD-10-CM | POA: Diagnosis not present

## 2023-03-01 DIAGNOSIS — I4510 Unspecified right bundle-branch block: Secondary | ICD-10-CM

## 2023-03-01 DIAGNOSIS — E782 Mixed hyperlipidemia: Secondary | ICD-10-CM

## 2023-03-01 NOTE — Assessment & Plan Note (Signed)
History of elevated coronary calcium of 1051 performed 09/18/2020 primarily in the LAD and RCA territories.  A subsequent Myoview stress test performed 03/22/2021 was entirely normal.  He is asymptomatic.

## 2023-03-01 NOTE — Patient Instructions (Signed)

## 2023-03-01 NOTE — Assessment & Plan Note (Signed)
History of hyperlipidemia on Repatha and Vascepa with lipid profile performed 09/15/2021 revealing total cholesterol 112, LDL 36 and HDL of 35.

## 2023-03-01 NOTE — Progress Notes (Signed)
03/01/2023 John Donaldson   Aug 17, 1955  [id number]  Primary Physician Chilton Greathouse, MD Primary Cardiologist: Runell Gess MD FACP, [place], [place], [place]  HPI:  John Donaldson is a 67 y.o.  mildly overweight but otherwise fit appearing married Caucasian male who I last saw in the office 03/05/2021.  His wife John Donaldson today who worked for [employer], got her Charity fundraiser and now is a Engineer, civil (consulting) at [employer].Marland Kitchen  He was referred by Dr. Felipa Eth, his PCP, because of an elevated coronary calcium score.  I last saw him in the office 05/26/2021.  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  He now does as needed consulting on homicide cases.  History of factors include untreated hyperlipidemia because of statin intolerance and diabetes.  There is no family history for heart disease other than a father who had CAD in his mid 43s.  He is never had a heart attack or stroke.  He denies chest pain or shortness of breath.  He is very active and plays golf twice a week and works out 3-4 times a week as well without symptoms.  He had a coronary calcium score performed by Dr. Felipa Eth  09/18/2020 which was 1051 with calcium primarily in the LAD and RCA although he is completely asymptomatic.  I performed Myoview stress testing on him 03/19/2021 which was entirely normal.  He is statin intolerant and he was recently begun on Repatha addition of Vascepa.  His last LDL performed 01/13/2021 was 127, and his most recent lipid profile performed 09/15/2021 revealed an LDL of 36.  Since I saw him in the office close the 2 years ago he continues to do well . he is very active, exercises on a routine basis and is completely asymptomatic.   Current Meds  Medication Sig   cetirizine (ZYRTEC) 10 MG tablet Take 10 mg by mouth daily.    Empagliflozin-metFORMIN HCl ER (SYNJARDY XR) 25-1000 MG TB24 Take 1 tablet by mouth daily with breakfast.   Evolocumab (REPATHA SURECLICK) 140 MG/ML SOAJ Inject 140 mg into the skin every 14 (fourteen) days.   fluticasone (FLONASE) 50 MCG/ACT nasal spray Place 2 sprays into both nostrils daily.   icosapent Ethyl (VASCEPA) 1 g capsule TAKE 2 CAPSULES BY MOUTH TWO TIMES DAILY   icosapent Ethyl (VASCEPA) 1 g capsule Take 2 capsules (2 g total) by mouth 2 (two) times daily with a meal.   Semaglutide, 1 MG/DOSE, (OZEMPIC, 1 MG/DOSE,) 4 MG/3ML SOPN Inject 1 mg into the skin once a week.   Tadalafil 2.5 MG TABS Take 1 tablet (2.5 mg total) by mouth.     Allergies  Allergen Reactions   Crestor [Rosuvastatin]     myalgias   Lipitor [Atorvastatin]     myalgias   Praluent [Alirocumab]     Elevated a1c and myalgias    Social History   Socioeconomic History   Marital status: Married    Spouse name: Not on file   Number of children: Not on file   Years of education: Not on file   Highest education level: Not on file  Occupational History   Not on file  Tobacco Use   Smoking status: Former    Types: Cigarettes, Cigars   Smokeless tobacco: Former  Quit date: 05/16/1992  Substance and Sexual Activity   Alcohol use: Yes    Comment: rare   Drug use: No   Sexual activity: Not on file  Other Topics Concern   Not on file  Social History Narrative   Not on file   Social Determinants of Health   Financial Resource Strain: Not on file  Food Insecurity: Not on file  Transportation Needs: Not on file  Physical Activity: Not on file  Stress: Not on file  Social Connections: Not on file  Intimate Partner Violence: Not on file     Review of Systems: General: negative for chills, fever, night sweats or weight changes.  Cardiovascular: negative for chest pain, dyspnea on exertion, edema, orthopnea, palpitations, paroxysmal nocturnal dyspnea or shortness of breath Dermatological: negative  for rash Respiratory: negative for cough or wheezing Urologic: negative for hematuria Abdominal: negative for nausea, vomiting, diarrhea, bright red blood per rectum, melena, or hematemesis Neurologic: negative for visual changes, syncope, or dizziness All other systems reviewed and are otherwise negative except as noted above.    Blood pressure 130/84, pulse 70, height 6\' 5"  (1.956 m), weight 237 lb 6.4 oz (107.7 kg), SpO2 97%.  General appearance: alert and no distress Neck: no adenopathy, no carotid bruit, no JVD, supple, symmetrical, trachea midline, and thyroid not enlarged, symmetric, no tenderness/mass/nodules Lungs: clear to auscultation bilaterally Heart: regular rate and rhythm, S1, S2 normal, no murmur, click, rub or gallop Extremities: extremities normal, atraumatic, no cyanosis or edema Pulses: 2+ and symmetric Skin: Skin color, texture, turgor normal. No rashes or lesions Neurologic: Grossly normal  EKG EKG Interpretation Date/Time:  Wednesday March 01 2023 09:27:55 EDT Ventricular Rate:  70 PR Interval:  196 QRS Duration:  128 QT Interval:  422 QTC Calculation: 455 R Axis:   125  Text Interpretation: Normal sinus rhythm Right bundle branch block When compared with ECG of 04-May-2017 22:19, Nonspecific T wave abnormality has replaced inverted T waves in Inferior leads Confirmed by Nanetta Batty [phone]) on 03/01/2023 9:31:27 AM    ASSESSMENT AND PLAN:   Elevated coronary artery calcium score History of elevated coronary calcium of 1051 performed 09/18/2020 primarily in the LAD and RCA territories.  A subsequent Myoview stress test performed 03/22/2021 was entirely normal.  He is asymptomatic.  Hyperlipidemia History of hyperlipidemia on Repatha and Vascepa with lipid profile performed 09/15/2021 revealing total cholesterol 112, LDL 36 and HDL of 35.  Right bundle branch block Chronic     Runell Gess MD [facility], [facility] 03/01/2023 9:46 AM

## 2023-03-01 NOTE — Telephone Encounter (Signed)
Pt was in office this morning to see Dr. Allyson Sabal. Pt mentioned that he needs a new prior auth for repatha. Per chart review prior auth obtained back on 6/18. Pt states that his insurance changed on July 1 and we will now need to do a prior [place] for new insurance. Insurance information has been enter in EMR. Will send to our PA team.

## 2023-03-01 NOTE — Assessment & Plan Note (Signed)
Chronic. 

## 2023-03-01 NOTE — Telephone Encounter (Signed)
Pharmacy Patient Advocate Encounter  Received notification from [facility] that Prior Authorization for repatha has been APPROVED from 03/01/23 to 08/30/23. Ran test claim, Copay is $141.27- one month. This test claim was processed through [facility]- copay amounts may vary at other pharmacies due to pharmacy/plan contracts, or as the patient moves through the different stages of their insurance plan.   PA #/Case ID/Reference #: [id number]

## 2023-03-01 NOTE — Telephone Encounter (Signed)
Pharmacy Patient Advocate Encounter   Received notification from Pt Calls Messages that prior authorization for repatha is required/requested.   Insurance verification completed.   The patient is insured through [facility] .   Per test claim: PA required; PA submitted to [facility] via CoverMyMeds Key/confirmation #/EOC [id number] Status is pending

## 2023-03-06 ENCOUNTER — Other Ambulatory Visit (HOSPITAL_COMMUNITY): Payer: Self-pay

## 2023-03-06 NOTE — Telephone Encounter (Signed)
Spoke with pt regarding prior authorization for repatha. Let pt know that he is approved through 08/30/23. Pt verbalizes understanding.

## 2023-03-07 ENCOUNTER — Other Ambulatory Visit (HOSPITAL_COMMUNITY): Payer: Self-pay

## 2023-03-28 ENCOUNTER — Other Ambulatory Visit (HOSPITAL_COMMUNITY): Payer: Self-pay

## 2023-04-07 ENCOUNTER — Other Ambulatory Visit (HOSPITAL_COMMUNITY): Payer: Self-pay

## 2023-04-07 ENCOUNTER — Other Ambulatory Visit: Payer: Self-pay

## 2023-04-25 ENCOUNTER — Other Ambulatory Visit (HOSPITAL_COMMUNITY): Payer: Self-pay

## 2023-04-28 ENCOUNTER — Other Ambulatory Visit (HOSPITAL_COMMUNITY): Payer: Self-pay

## 2023-04-29 ENCOUNTER — Other Ambulatory Visit (HOSPITAL_COMMUNITY): Payer: Self-pay

## 2023-05-08 ENCOUNTER — Other Ambulatory Visit (HOSPITAL_COMMUNITY): Payer: Self-pay

## 2023-05-11 ENCOUNTER — Other Ambulatory Visit (HOSPITAL_COMMUNITY): Payer: Self-pay

## 2023-05-18 ENCOUNTER — Other Ambulatory Visit (HOSPITAL_BASED_OUTPATIENT_CLINIC_OR_DEPARTMENT_OTHER): Payer: Self-pay

## 2023-05-18 ENCOUNTER — Other Ambulatory Visit (HOSPITAL_COMMUNITY): Payer: Self-pay

## 2023-05-19 ENCOUNTER — Other Ambulatory Visit (HOSPITAL_COMMUNITY): Payer: Self-pay

## 2023-05-24 ENCOUNTER — Other Ambulatory Visit (HOSPITAL_COMMUNITY): Payer: Self-pay

## 2023-06-06 ENCOUNTER — Other Ambulatory Visit (HOSPITAL_COMMUNITY): Payer: Self-pay

## 2023-06-06 MED ORDER — TADALAFIL 2.5 MG PO TABS
2.5000 mg | ORAL_TABLET | Freq: Every day | ORAL | 5 refills | Status: DC
  Filled 2023-06-06: qty 30, 30d supply, fill #0
  Filled 2023-07-03: qty 30, 30d supply, fill #1

## 2023-06-06 MED ORDER — TADALAFIL 2.5 MG PO TABS
2.5000 mg | ORAL_TABLET | Freq: Every day | ORAL | 3 refills | Status: AC
  Filled 2023-06-06 – 2023-08-09 (×2): qty 90, 90d supply, fill #0
  Filled 2023-11-07: qty 90, 90d supply, fill #1
  Filled 2024-02-05: qty 90, 90d supply, fill #2
  Filled 2024-05-06: qty 90, 90d supply, fill #3

## 2023-06-07 ENCOUNTER — Other Ambulatory Visit (HOSPITAL_COMMUNITY): Payer: Self-pay

## 2023-06-20 ENCOUNTER — Other Ambulatory Visit (HOSPITAL_COMMUNITY): Payer: Self-pay

## 2023-06-20 MED ORDER — AZITHROMYCIN 500 MG PO TABS
500.0000 mg | ORAL_TABLET | Freq: Every day | ORAL | 0 refills | Status: DC
  Filled 2023-06-20: qty 3, 3d supply, fill #0

## 2023-07-03 ENCOUNTER — Other Ambulatory Visit (HOSPITAL_COMMUNITY): Payer: Self-pay

## 2023-07-31 ENCOUNTER — Other Ambulatory Visit (HOSPITAL_COMMUNITY): Payer: Self-pay

## 2023-08-09 ENCOUNTER — Other Ambulatory Visit (HOSPITAL_COMMUNITY): Payer: Self-pay

## 2023-08-10 ENCOUNTER — Other Ambulatory Visit (HOSPITAL_COMMUNITY): Payer: Self-pay

## 2023-08-11 ENCOUNTER — Other Ambulatory Visit (HOSPITAL_COMMUNITY): Payer: Self-pay

## 2023-08-12 ENCOUNTER — Other Ambulatory Visit (HOSPITAL_COMMUNITY): Payer: Self-pay

## 2023-08-14 ENCOUNTER — Other Ambulatory Visit (HOSPITAL_COMMUNITY): Payer: Self-pay

## 2023-09-06 ENCOUNTER — Other Ambulatory Visit (HOSPITAL_COMMUNITY): Payer: Self-pay

## 2023-09-07 ENCOUNTER — Other Ambulatory Visit (HOSPITAL_COMMUNITY): Payer: Self-pay

## 2023-09-07 MED ORDER — OZEMPIC (1 MG/DOSE) 4 MG/3ML ~~LOC~~ SOPN
1.0000 mg | PEN_INJECTOR | SUBCUTANEOUS | 6 refills | Status: DC
  Filled 2023-09-07: qty 9, 84d supply, fill #0

## 2023-09-22 ENCOUNTER — Other Ambulatory Visit (HOSPITAL_COMMUNITY): Payer: Self-pay

## 2023-10-05 ENCOUNTER — Other Ambulatory Visit (HOSPITAL_COMMUNITY): Payer: Self-pay

## 2023-10-19 ENCOUNTER — Other Ambulatory Visit (HOSPITAL_COMMUNITY): Payer: Self-pay

## 2023-10-30 ENCOUNTER — Other Ambulatory Visit (HOSPITAL_COMMUNITY): Payer: Self-pay

## 2023-10-30 MED ORDER — OZEMPIC (2 MG/DOSE) 8 MG/3ML ~~LOC~~ SOPN
2.0000 mg | PEN_INJECTOR | SUBCUTANEOUS | 5 refills | Status: DC
  Filled 2023-10-30: qty 3, 28d supply, fill #0
  Filled 2023-12-18: qty 3, 28d supply, fill #1
  Filled 2024-01-12: qty 3, 28d supply, fill #2
  Filled 2024-02-25: qty 3, 28d supply, fill #3
  Filled 2024-03-20: qty 3, 28d supply, fill #4
  Filled 2024-04-22: qty 3, 28d supply, fill #5

## 2023-11-08 ENCOUNTER — Other Ambulatory Visit (HOSPITAL_COMMUNITY): Payer: Self-pay

## 2023-11-09 ENCOUNTER — Other Ambulatory Visit: Payer: Self-pay

## 2023-11-09 ENCOUNTER — Other Ambulatory Visit (HOSPITAL_COMMUNITY): Payer: Self-pay

## 2023-11-10 ENCOUNTER — Other Ambulatory Visit (HOSPITAL_COMMUNITY): Payer: Self-pay

## 2023-11-10 ENCOUNTER — Ambulatory Visit: Payer: Self-pay | Admitting: Surgery

## 2023-11-10 NOTE — H&P (Signed)
 Subjective    Chief Complaint: Umbilical Hernia   Dr. Janey - [facility]   History of Present Illness: John Donaldson is a 68 y.o. male who is seen today as an office consultation at the request of Dr. Stephanie for evaluation of Umbilical Hernia .   This is a 68 year old male who presents with several years of a slowly enlarging bulge at his umbilicus.  He also has a bulge in his upper abdomen when he is bending over but this resolves when he is standing or supine.  He reports no pain with the hernia.  He does feel some fullness when he bends over.  No nausea or vomiting.  Bowel movements are normal.  The patient occasionally has some reflux problems.  He mentions all of these concerns to Dr. Janey who referred him to talk to us  about possible hernias.   The patient had a cardiac CT scan in 2023 and a chest x-ray in 2018 that showed no sign of hiatal hernia.   Review of Systems: A complete review of systems was obtained from the patient.  I have reviewed this information and discussed as appropriate with the patient.  See HPI as well for other ROS.   Review of Systems  Constitutional: Negative.   HENT: Negative.    Eyes: Negative.   Respiratory: Negative.    Cardiovascular: Negative.   Gastrointestinal: Negative.   Genitourinary: Negative.   Musculoskeletal: Negative.   Skin: Negative.   Neurological: Negative.   Endo/Heme/Allergies: Negative.   Psychiatric/Behavioral: Negative.          Medical History: Past Medical History      Past Medical History:  Diagnosis Date   Diabetes mellitus without complication (CMS/HHS-HCC)          Problem List     Patient Active Problem List  Diagnosis   Elevated coronary artery calcium  score   Pain of cervical spine   Right bundle branch block   Umbilical hernia without obstruction or gangrene   Rectus diastasis        Past Surgical History       Past Surgical History:  Procedure Laterality Date   EXCISION HYDROCELE BILATERAL       Knee  surgery            Allergies  No Known Allergies     Medications Ordered Prior to Encounter        Current Outpatient Medications on File Prior to Visit  Medication Sig Dispense Refill   cetirizine (ZYRTEC) 10 MG tablet Take 10 mg by mouth once daily       empagliflozin -metFORMIN  (SYNJARDY  XR) 25-1,000 mg XR 24 hr biphasic tablet Take 1 tablet by mouth once daily       OZEMPIC  2 mg/dose (8 mg/3 mL) pen injector Inject 2 mg subcutaneously every 7 (seven) days       REPATHA  SURECLICK 140 mg/mL PnIj Inject 140 mg subcutaneously every 14 (fourteen) days       tadalafiL  (CIALIS ) 2.5 mg tablet Take 2.5 mg by mouth once daily       icosapent  ethyL (VASCEPA ) 1 gram capsule Take 2 g by mouth 2 (two) times daily (Patient not taking: Reported on 11/10/2023)        No current facility-administered medications on file prior to visit.        Family History       Family History  Problem Relation Age of Onset   Stroke Mother     Hyperlipidemia (Elevated cholesterol)  Mother     Coronary Artery Disease (Blocked arteries around heart) Mother     Skin cancer Father     High blood pressure (Hypertension) Father     Coronary Artery Disease (Blocked arteries around heart) Father          Tobacco Use History  Social History       Tobacco Use  Smoking Status Never  Smokeless Tobacco Never        Social History  Social History         Socioeconomic History   Marital status: Married  Tobacco Use   Smoking status: Never   Smokeless tobacco: Never  Substance and Sexual Activity   Alcohol use: Yes      Alcohol/week: 0.0 - 1.0 standard drinks of alcohol   Drug use: Never    Social Drivers of Health        Housing Stability: Unknown (11/10/2023)    Housing Stability Vital Sign     Homeless in the Last Year: No        Objective:          Vitals:    11/10/23 0937 11/10/23 0938  BP: (!) 150/98    Pulse: 90    Temp: 36.5 C (97.7 F)    SpO2: 97%    Weight: (!) 108.4 kg (239  lb)    Height: 195.6 cm (6' 5)    PainSc:   0-No pain    Body mass index is 28.34 kg/m.   Physical Exam    Constitutional:  WDWN in NAD, conversant, no obvious deformities; lying in bed comfortably Eyes:  Pupils equal, round; sclera anicteric; moist conjunctiva; no lid lag HENT:  Oral mucosa moist; good dentition  Neck:  No masses palpated, trachea midline; no thyromegaly Lungs:  CTA bilaterally; normal respiratory effort CV:  Regular rate and rhythm; no murmurs; extremities well-perfused with no edema Abd:  +bowel sounds, soft, non-tender, no palpable organomegaly; patient has a upper midline rectus diastases when contracting his abdominal muscles.  No sign of ventral hernia in these areas.  Patient has a small reducible umbilical hernia with a 1.5 cm fascial defect.  The hernia seems to contain only a small amount of fat. GU: Bilateral descended testes, no testicular masses, no sign of inguinal hernia on either side. Musc: Normal gait; no apparent clubbing or cyanosis in extremities Lymphatic:  No palpable cervical or axillary lymphadenopathy Skin:  Warm, dry; no sign of jaundice Psychiatric - alert and oriented x 4; calm mood and affect       Assessment and Plan:  Diagnoses and all orders for this visit:   Umbilical hernia without obstruction or gangrene   Rectus diastasis       No intervention recommended for rectus diastases. Recommend umbilical hernia repair with mesh..The surgical procedure has been discussed with the patient.  Potential risks, benefits, alternative treatments, and expected outcomes have been explained.  All of the patient's questions at this time have been answered.  The likelihood of reaching the patient's treatment goal is good.  The patient understands the proposed surgical procedure and wishes to proceed. The patient wants to wait several months before having his surgery.  We discussed the possibility of incarceration or strangulation but I think that  is very low risk at this time.  He will call us  about a month before he wants to have his surgery performed.   John Donaldson John LIMA, MD  11/10/2023 10:10 AM

## 2023-11-13 ENCOUNTER — Other Ambulatory Visit (HOSPITAL_COMMUNITY): Payer: Self-pay

## 2023-11-13 ENCOUNTER — Other Ambulatory Visit: Payer: Self-pay

## 2023-12-10 ENCOUNTER — Other Ambulatory Visit: Payer: Self-pay | Admitting: Cardiovascular Disease

## 2023-12-11 ENCOUNTER — Other Ambulatory Visit (HOSPITAL_COMMUNITY): Payer: Self-pay

## 2023-12-11 ENCOUNTER — Other Ambulatory Visit: Payer: Self-pay

## 2023-12-11 MED ORDER — REPATHA SURECLICK 140 MG/ML ~~LOC~~ SOAJ
140.0000 mg | SUBCUTANEOUS | 3 refills | Status: AC
  Filled 2023-12-11: qty 6, 84d supply, fill #0
  Filled 2024-02-25: qty 6, 84d supply, fill #1
  Filled 2024-05-24: qty 6, 84d supply, fill #2

## 2024-01-05 ENCOUNTER — Other Ambulatory Visit (HOSPITAL_COMMUNITY): Payer: Self-pay

## 2024-01-05 MED ORDER — SYNJARDY XR 25-1000 MG PO TB24
1.0000 | ORAL_TABLET | Freq: Every day | ORAL | 3 refills | Status: AC
  Filled 2024-01-05: qty 90, 90d supply, fill #0
  Filled 2024-03-27: qty 90, 90d supply, fill #1

## 2024-02-06 ENCOUNTER — Other Ambulatory Visit (HOSPITAL_COMMUNITY): Payer: Self-pay

## 2024-02-06 MED ORDER — ICOSAPENT ETHYL 1 G PO CAPS
2.0000 g | ORAL_CAPSULE | Freq: Two times a day (BID) | ORAL | 3 refills | Status: AC
  Filled 2024-02-06: qty 360, 90d supply, fill #0
  Filled 2024-05-08: qty 360, 90d supply, fill #1

## 2024-02-26 ENCOUNTER — Encounter: Payer: Self-pay | Admitting: Cardiovascular Disease

## 2024-02-26 ENCOUNTER — Ambulatory Visit: Attending: Cardiovascular Disease | Admitting: Cardiovascular Disease

## 2024-02-26 VITALS — BP 124/76 | HR 82 | Ht 77.0 in | Wt 237.8 lb

## 2024-02-26 DIAGNOSIS — R931 Abnormal findings on diagnostic imaging of heart and coronary circulation: Secondary | ICD-10-CM

## 2024-02-26 DIAGNOSIS — E785 Hyperlipidemia, unspecified: Secondary | ICD-10-CM

## 2024-02-26 DIAGNOSIS — Z01818 Encounter for other preprocedural examination: Secondary | ICD-10-CM | POA: Insufficient documentation

## 2024-02-26 DIAGNOSIS — E782 Mixed hyperlipidemia: Secondary | ICD-10-CM | POA: Diagnosis not present

## 2024-02-26 DIAGNOSIS — I4510 Unspecified right bundle-branch block: Secondary | ICD-10-CM

## 2024-02-26 NOTE — Assessment & Plan Note (Signed)
 Elevated coronary calcium  score of 1051 performed 09/18/2020 primarily in the LAD and RCA territories.  Subsequent Myoview  stress test performed 11//22 was normal.  He is active and asymptomatic.

## 2024-02-26 NOTE — Assessment & Plan Note (Signed)
 History of hyperlipidemia on Vascepa  and Repatha  with lipid profile performed 10/23/2023 revealing total cholesterol 118, LDL 13 and HDL of 40.

## 2024-02-26 NOTE — Assessment & Plan Note (Signed)
 Patient apparently needs a ventral hernia repair.  He has been seen by Dr.Tsui.  Given his lack of symptoms, low risk procedure and negative stress test 3 years ago I think he can be cleared at low risk.

## 2024-02-26 NOTE — Assessment & Plan Note (Signed)
 Chronic

## 2024-02-26 NOTE — Progress Notes (Signed)
 02/26/2024 John Donaldson   May 08, 1956  [id number]  Primary Physician Janey Santos, MD Primary Cardiologist: Dorn JINNY Lesches MD FACP, [place], [place], [place]  HPI:  John Donaldson is a 68 y.o.   mildly overweight but otherwise fit appearing married Caucasian male who I last saw in the office 03/01/2023.  His wife Bari today who worked for [employer], got her Charity fundraiser and now is a Engineer, civil (consulting) at [employer].SABRA  He was referred by Dr. Janey, his PCP, because of an elevated coronary calcium  score.  I last saw him in the office 05/26/2021.  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  He now does as needed consulting on homicide cases.  History of factors include untreated hyperlipidemia because of statin intolerance and diabetes.  There is no family history for heart disease other than a father who had CAD in his mid 29s.  He is never had a heart attack or stroke.  He denies chest pain or shortness of breath.  He is very active and plays golf twice a week and works out 3-4 times a week as well without symptoms.  He had a coronary calcium  score performed by Dr. Janey  09/18/2020 which was 1051 with calcium  primarily in the LAD and RCA although he is completely asymptomatic.  I performed Myoview  stress testing on him 03/19/2021 which was entirely normal.  He is statin intolerant and he was recently begun on Repatha  addition of Vascepa .  His last LDL performed 01/13/2021 was 127, and his most recent lipid profile performed 09/15/2021 revealed an LDL of 36.   Since I saw him in the office a year ago he has remained stable.  He still working in the consultative basis.  He is active and asymptomatic.  He apparently needs a ventral hernia repair by Dr. Emeline in the near future.   Current Meds  Medication Sig    cetirizine (ZYRTEC) 10 MG tablet Take 10 mg by mouth daily.   Empagliflozin -metFORMIN  HCl ER (SYNJARDY  XR) 25-1000 MG TB24 Take 1 tablet by mouth daily.   Evolocumab  (REPATHA  SURECLICK) 140 MG/ML SOAJ Inject 140 mg into the skin every 14 (fourteen) days.   fluticasone  (FLONASE ) 50 MCG/ACT nasal spray Place 2 sprays into both nostrils daily.   icosapent  Ethyl (VASCEPA ) 1 g capsule Take 2 capsules (2 g total) by mouth 2 (two) times daily with a meal.   Semaglutide , 2 MG/DOSE, (OZEMPIC , 2 MG/DOSE,) 8 MG/3ML SOPN Inject 2 mg into the skin once a week.   Tadalafil  2.5 MG TABS Take 1 tablet (2.5 mg total) by mouth daily.     Allergies  Allergen Reactions   Crestor  [Rosuvastatin ]     myalgias   Lipitor [Atorvastatin]     myalgias   Praluent  [Alirocumab ]     Elevated a1c and myalgias    Social History   Socioeconomic History   Marital status: Married    Spouse name: Not on file   Number of children: Not on file   Years of education: Not on file   Highest education level: Not on file  Occupational History   Not on file  Tobacco Use   Smoking status: Former    Types: Cigarettes, Cigars   Smokeless tobacco: Former    Quit  date: 05/16/1992  Substance and Sexual Activity   Alcohol use: Yes    Comment: rare   Drug use: No   Sexual activity: Not on file  Other Topics Concern   Not on file  Social History Narrative   Not on file   Social Drivers of Health   Financial Resource Strain: Not on file  Food Insecurity: Not on file  Transportation Needs: Not on file  Physical Activity: Not on file  Stress: Not on file  Social Connections: Not on file  Intimate Partner Violence: Not on file     Review of Systems: General: negative for chills, fever, night sweats or weight changes.  Cardiovascular: negative for chest pain, dyspnea on exertion, edema, orthopnea, palpitations, paroxysmal nocturnal dyspnea or shortness of breath Dermatological: negative for rash Respiratory: negative for  cough or wheezing Urologic: negative for hematuria Abdominal: negative for nausea, vomiting, diarrhea, bright red blood per rectum, melena, or hematemesis Neurologic: negative for visual changes, syncope, or dizziness All other systems reviewed and are otherwise negative except as noted above.    Blood pressure 124/76, pulse 82, height 6' 5 (1.956 m), weight 237 lb 12.8 oz (107.9 kg), SpO2 92%.  General appearance: alert and no distress Neck: no adenopathy, no carotid bruit, no JVD, supple, symmetrical, trachea midline, and thyroid not enlarged, symmetric, no tenderness/mass/nodules Lungs: clear to auscultation bilaterally Heart: regular rate and rhythm, S1, S2 normal, no murmur, click, rub or gallop Extremities: extremities normal, atraumatic, no cyanosis or edema Pulses: 2+ and symmetric Skin: Skin color, texture, turgor normal. No rashes or lesions Neurologic: Grossly normal  EKG EKG Interpretation Date/Time:  Monday February 26 2024 07:59:55 EDT Ventricular Rate:  82 PR Interval:  192 QRS Duration:  132 QT Interval:  400 QTC Calculation: 467 R Axis:   129  Text Interpretation: Normal sinus rhythm Right bundle branch block Left posterior fascicular block Bifascicular block T wave abnormality, consider inferior ischemia When compared with ECG of 01-Mar-2023 09:27, T wave inversion now evident in Inferior leads Confirmed by Court Carrier [phone]) on 02/26/2024 8:06:04 AM    ASSESSMENT AND PLAN:   Elevated coronary artery calcium  score Elevated coronary calcium  score of 1051 performed 09/18/2020 primarily in the LAD and RCA territories.  Subsequent Myoview  stress test performed 11//22 was normal.  He is active and asymptomatic.  Hyperlipidemia History of hyperlipidemia on Vascepa  and Repatha  with lipid profile performed 10/23/2023 revealing total cholesterol 118, LDL 13 and HDL of 40.  Right bundle branch block Chronic  Preoperative clearance Patient apparently needs a ventral  hernia repair.  He has been seen by Dr.Tsui.  Given his lack of symptoms, low risk procedure and negative stress test 3 years ago I think he can be cleared at low risk.     Carrier DOROTHA Court MD FACP,FACC,FAHA, [facility] 02/26/2024 8:20 AM

## 2024-02-26 NOTE — Patient Instructions (Signed)

## 2024-03-21 ENCOUNTER — Ambulatory Visit: Payer: Self-pay | Admitting: Surgery

## 2024-03-26 ENCOUNTER — Other Ambulatory Visit (HOSPITAL_COMMUNITY): Payer: Self-pay

## 2024-03-26 MED ORDER — FLUOROURACIL 5 % EX CREA
TOPICAL_CREAM | Freq: Two times a day (BID) | CUTANEOUS | 2 refills | Status: AC
  Filled 2024-03-26: qty 40, 30d supply, fill #0

## 2024-03-28 ENCOUNTER — Other Ambulatory Visit: Payer: Self-pay

## 2024-04-05 NOTE — Patient Instructions (Signed)
 SURGICAL WAITING ROOM VISITATION  Patients having surgery or a procedure may have no more than 2 support people in the waiting area - these visitors may rotate.    Children under the age of 4 must have an adult with them who is not the patient.  Visitors with respiratory illnesses are discouraged from visiting and should remain at home.  If the patient needs to stay at the hospital during part of their recovery, the visitor guidelines for inpatient rooms apply. Pre-op nurse will coordinate an appropriate time for 1 support person to accompany patient in pre-op.  This support person may not rotate.    Please refer to the [facility] website for the visitor guidelines for Inpatients (after your surgery is over and you are in a regular room).       Your procedure is scheduled on: 04/25/24   Report to [facility] Main Entrance    Report to admitting at 9:45 AM   Call this number if you have problems the morning of surgery [phone]   Do not eat food :After Midnight.   After Midnight you may have the following liquids until 9 AM DAY OF SURGERY  Water Non-Citrus Juices (without pulp, NO RED-Apple, White grape, White cranberry) Black Coffee (NO MILK/CREAM OR CREAMERS, sugar ok)  Clear Tea (NO MILK/CREAM OR CREAMERS, sugar ok) regular and decaf                             Plain Jell-O (NO RED)                                           Fruit ices (not with fruit pulp, NO RED)                                     Popsicles (NO RED)                                                               Sports drinks like Gatorade (NO RED)                 Oral Hygiene is also important to reduce your risk of infection.                                    Remember - BRUSH YOUR TEETH THE MORNING OF SURGERY WITH YOUR REGULAR TOOTHPASTE    Stop all vitamins and herbal supplements 7 days before surgery.   Take these medicines the morning of surgery with A SIP OF WATER: cetirizine(zyrtec),  nasal spray, vascepa (icosapent )  DO NOT TAKE ANY ORAL DIABETIC MEDICATIONS DAY OF YOUR SURGERY Hold Sanjardy for 72 hours prior to surgery. Last dose to be 04/21/24 Hold Ozempic  for at least 7 days prior to surgery.                               You may not have any  metal on your body including hair pins, jewelry, and body piercing             Do not wear make-up, lotions, powders, perfumes/cologne, or deodorant               Men may shave face and neck.   Do not bring valuables to the hospital. [facility] IS NOT             RESPONSIBLE   FOR VALUABLES.   Contacts, glasses, dentures or bridgework may not be worn into surgery.    DO NOT BRING YOUR HOME MEDICATIONS TO THE HOSPITAL. PHARMACY WILL DISPENSE MEDICATIONS LISTED ON YOUR MEDICATION LIST TO YOU DURING YOUR ADMISSION IN THE HOSPITAL!    Patients discharged on the day of surgery will not be allowed to drive home.  Someone NEEDS to stay with you for the first 24 hours after anesthesia.   Special Instructions: Bring a copy of your healthcare power of attorney and living will documents the day of surgery if you haven't scanned them before.              Please read over the following fact sheets you were given: IF YOU HAVE QUESTIONS ABOUT YOUR PRE-OP INSTRUCTIONS PLEASE CALL [phone] John Donaldson   If you received a COVID test during your pre-op visit  it is requested that you wear a mask when out in public, stay away from anyone that may not be feeling well and notify your surgeon if you develop symptoms. If you test positive for Covid or have been in contact with anyone that has tested positive in the last 10 days please notify you surgeon.    [facility] - Preparing for Surgery Before surgery, you can play an important role.  Because skin is not sterile, your skin needs to be as free of germs as possible.  You can reduce the number of germs on your skin by washing with CHG (chlorahexidine gluconate) soap before surgery.  CHG is an  antiseptic cleaner which kills germs and bonds with the skin to continue killing germs even after washing. Please DO NOT use if you have an allergy to CHG or antibacterial soaps.  If your skin becomes reddened/irritated stop using the CHG and inform your nurse when you arrive at Short Stay. Do not shave (including legs and underarms) for at least 48 hours prior to the first CHG shower.  You may shave your face/neck.  Please follow these instructions carefully:  1.  Shower with CHG Soap the night before surgery ONLY (DO NOT USE THE SOAP THE MORNING OF SURGERY).  2.  If you choose to wash your hair, wash your hair first as usual with your normal  shampoo.  3.  After you shampoo, rinse your hair and body thoroughly to remove the shampoo.                             4.  Use CHG as you would any other liquid soap.  You can apply chg directly to the skin and wash.  Gently with a scrungie or clean washcloth.  5.  Apply the CHG Soap to your body ONLY FROM THE NECK DOWN.   Do   not use on face/ open                           Wound or open sores. Avoid contact with  eyes, ears mouth and   genitals (private parts).                       Wash face,  Genitals (private parts) with your normal soap.             6.  Wash thoroughly, paying special attention to the area where your    surgery  will be performed.  7.  Thoroughly rinse your body with warm water from the neck down.  8.  DO NOT shower/wash with your normal soap after using and rinsing off the CHG Soap.                9.  Pat yourself dry with a clean towel.            10.  Wear clean pajamas.            11.  Place clean sheets on your bed the night of your first shower and do not  sleep with pets. Day of Surgery : Do not apply any CHG, lotions/deodorants the morning of surgery.  Please wear clean clothes to the hospital/surgery center.  FAILURE TO FOLLOW THESE INSTRUCTIONS MAY RESULT IN THE CANCELLATION OF YOUR SURGERY  PATIENT  SIGNATURE_________________________________  NURSE SIGNATURE__________________________________  _______________________How to Manage Your Diabetes Before and After Surgery  Why is it important to control my blood sugar before and after surgery? Improving blood sugar levels before and after surgery helps healing and can limit problems. A way of improving blood sugar control is eating a healthy diet by:  Eating less sugar and carbohydrates  Increasing activity/exercise  Talking with your doctor about reaching your blood sugar goals High blood sugars (greater than 180 mg/dL) can raise your risk of infections and slow your recovery, so you will need to focus on controlling your diabetes during the weeks before surgery. Make sure that the doctor who takes care of your diabetes knows about your planned surgery including the date and location.  How do I manage my blood sugar before surgery? Check your blood sugar at least 4 times a day, starting 2 days before surgery, to make sure that the level is not too high or low. Check your blood sugar the morning of your surgery when you wake up and every 2 hours until you get to the Short Stay unit. If your blood sugar is less than 70 mg/dL, you will need to treat for low blood sugar: Do not take insulin. Treat a low blood sugar (less than 70 mg/dL) with  cup of clear juice (cranberry or apple), 4 glucose tablets, OR glucose gel. Recheck blood sugar in 15 minutes after treatment (to make sure it is greater than 70 mg/dL). If your blood sugar is not greater than 70 mg/dL on recheck, call [phone] for further instructions. Report your blood sugar to the short stay nurse when you get to Short Stay.  If you are admitted to the hospital after surgery: Your blood sugar will be checked by the staff and you will probably be given insulin after surgery (instead of oral diabetes medicines) to make sure you have good blood sugar levels. The goal for blood sugar  control after surgery is 80-180 mg/dL.   WHAT DO I DO ABOUT MY DIABETES MEDICATION?  Do not take oral diabetes medicines (pills) the morning of surgery.Hold Synjardy  for 72 hours. Last dose to be 04/21/24.    DO NOT TAKE THE FOLLOWING 7 DAYS PRIOR TO SURGERY:  Ozempic , Wegovy , Rybelsus  (Semaglutide ), Byetta (exenatide), Bydureon (exenatide ER), Victoza, Saxenda (liraglutide), or Trulicity (dulaglutide) Mounjaro (Tirzepatide) Adlyxin (Lixisenatide), Polyethylene Glycol Loxenatide.   Patient Signature:  Date:   Nurse Signature:  Date:   Reviewed and Endorsed by [facility] Patient Education Committee, August 2015_________________________________________________

## 2024-04-05 NOTE — Progress Notes (Signed)
 COVID Vaccine received:  []  No [x]  Yes Date of any COVID positive Test in last 90 days: no PCP - Searcy Overcast MD Cardiologist - Dorn Lesches MD  Chest x-ray -  EKG -  02/26/24 Epic Stress Test - 03/19/21 Epic ECHO -  Cardiac Cath -   Cardiac clearance-Dr. DOROTHA Lesches- 02/26/24  Bowel Prep - [x]  No  []   Yes ______  Pacemaker / ICD device [x]  No []  Yes   Spinal Cord Stimulator:[x]  No []  Yes       History of Sleep Apnea? [x]  No []  Yes   CPAP used?- [x]  No []  Yes    Does the patient monitor blood sugar?          []  No []  Yes  []  N/A  Patient has: [x]  NO Hx DM   []  Pre-DM                 []  DM1  [x]   DM2 Does patient have a [employer] or Dexacom? [x]  No []  Yes   Fasting Blood Sugar Ranges-  Checks Blood Sugar ____0_ times a day  GLP1 agonist / usual dose - Ozempic  Last dose 04/13/24. GLP1 instructions:  SGLT-2 inhibitors / usual dose - no SGLT-2 instructions:   Blood Thinner / Instructions:no Aspirin Instructions:no  Comments:   Activity level: Patient is able to climb a flight of stairs without difficulty; [x]  No CP  [x]  No SOB,    Patient can   perform ADLs without assistance.   Anesthesia review: cardiac clearance- Dr. Lesches- 02/26/24, DM, R BBB, Elevated calcium  score  Patient denies shortness of breath, fever, cough and chest pain at PAT appointment.  Patient verbalized understanding and agreement to the Pre-Surgical Instructions that were given to them at this PAT appointment. Patient was also educated of the need to review these PAT instructions again prior to his/her surgery.I reviewed the appropriate phone numbers to call if they have any and questions or concerns.

## 2024-04-08 ENCOUNTER — Encounter (HOSPITAL_COMMUNITY): Payer: Self-pay

## 2024-04-08 ENCOUNTER — Other Ambulatory Visit: Payer: Self-pay

## 2024-04-08 ENCOUNTER — Encounter (HOSPITAL_COMMUNITY)
Admission: RE | Admit: 2024-04-08 | Discharge: 2024-04-08 | Disposition: A | Discharge status: Home / Self Care | Source: Ambulatory Visit | Attending: Surgery | Admitting: Surgery

## 2024-04-08 VITALS — BP 131/91 | HR 76 | Temp 97.5°F | Resp 16 | Ht 77.0 in | Wt 228.0 lb

## 2024-04-08 DIAGNOSIS — K429 Umbilical hernia without obstruction or gangrene: Secondary | ICD-10-CM | POA: Insufficient documentation

## 2024-04-08 DIAGNOSIS — Z87891 Personal history of nicotine dependence: Secondary | ICD-10-CM | POA: Diagnosis not present

## 2024-04-08 DIAGNOSIS — E119 Type 2 diabetes mellitus without complications: Secondary | ICD-10-CM | POA: Diagnosis not present

## 2024-04-08 DIAGNOSIS — K219 Gastro-esophageal reflux disease without esophagitis: Secondary | ICD-10-CM | POA: Diagnosis not present

## 2024-04-08 DIAGNOSIS — Z01812 Encounter for preprocedural laboratory examination: Secondary | ICD-10-CM | POA: Insufficient documentation

## 2024-04-08 DIAGNOSIS — I4891 Unspecified atrial fibrillation: Secondary | ICD-10-CM | POA: Diagnosis not present

## 2024-04-08 DIAGNOSIS — Z01818 Encounter for other preprocedural examination: Secondary | ICD-10-CM

## 2024-04-08 HISTORY — DX: Malignant (primary) neoplasm, unspecified: C80.1

## 2024-04-08 HISTORY — DX: Gastro-esophageal reflux disease without esophagitis: K21.9

## 2024-04-08 LAB — HEMOGLOBIN A1C
Hgb A1c MFr Bld: 8.3 % — ABNORMAL HIGH (ref 4.8–5.6)
Mean Plasma Glucose: 192 mg/dL

## 2024-04-08 LAB — CBC
HCT: 49.4 % (ref 39.0–52.0)
Hemoglobin: 16.8 g/dL (ref 13.0–17.0)
MCH: 31.6 pg (ref 26.0–34.0)
MCHC: 34 g/dL (ref 30.0–36.0)
MCV: 93 fL (ref 80.0–100.0)
Platelets: 187 K/uL (ref 150–400)
RBC: 5.31 MIL/uL (ref 4.22–5.81)
RDW: 12.8 % (ref 11.5–15.5)
WBC: 6.4 K/uL (ref 4.0–10.5)
nRBC: 0 % (ref 0.0–0.2)

## 2024-04-08 LAB — BASIC METABOLIC PANEL WITH GFR
Anion gap: 10 (ref 5–15)
BUN: 21 mg/dL (ref 8–23)
CO2: 22 mmol/L (ref 22–32)
Calcium: 9.6 mg/dL (ref 8.9–10.3)
Chloride: 103 mmol/L (ref 98–111)
Creatinine, Ser: 0.96 mg/dL (ref 0.61–1.24)
GFR, Estimated: >60 mL/min (ref >60)
Glucose, Bld: 178 mg/dL — ABNORMAL HIGH (ref 70–99)
Potassium: 4.8 mmol/L (ref 3.5–5.1)
Sodium: 136 mmol/L (ref 135–145)

## 2024-04-08 LAB — GLUCOSE, CAPILLARY: Glucose-Capillary: 203 mg/dL — ABNORMAL HIGH (ref 70–99)

## 2024-04-09 NOTE — Progress Notes (Signed)
 Request sent to Dr. Belinda to review pt's pre op A1c from 04/08/24.

## 2024-04-09 NOTE — Progress Notes (Signed)
 Anesthesia Chart Review   Case: [id number] Date/Time: 04/25/24 1145   Procedure: REPAIR, HERNIA, UMBILICAL, ADULT - UMBILICAL HERNIA, MESH LMA   Anesthesia type: General   Diagnosis: Umbilical hernia without obstruction or gangrene [K42.9]   Pre-op diagnosis: UMBILICAL HERNIA   Location: [facility] ROOM 01 / [facility]   Surgeons: Belinda Cough, MD       DISCUSSION:68 y.o. former smoker with h/o GERD, DM II, remote history with no recurrence, atrial fibrillation, umbilical hernia scheduled for above procedure 04/25/24 with Dr. Cough Belinda.   Pt last seen by cardiology 02/26/2024. Per OV note, Patient apparently needs a ventral hernia repair.  He has been seen by Dr.Tsui.  Given his lack of symptoms, low risk procedure and negative stress test 3 years ago I think he can be cleared at low risk.   A1C 8.3, evaluate glucose DOS. Forwarded to PCP and careers adviser.   Pt advised to hold Ozempic  1 week prior to procedure.  VS: BP (!) 131/91   Pulse 76   Temp (!) 36.4 C (Oral)   Resp 16   Ht 6' 5 (1.956 m)   Wt 103.4 kg   SpO2 97%   BMI 27.04 kg/m   PROVIDERS: Avva, Ravisankar, MD is PCP    LABS: Labs reviewed: Acceptable for surgery. (all labs ordered are listed, but only abnormal results are displayed)  Labs Reviewed  HEMOGLOBIN A1C - Abnormal; Notable for the following components:      Result Value   Hgb A1c MFr Bld 8.3 (*)    All other components within normal limits  BASIC METABOLIC PANEL WITH GFR - Abnormal; Notable for the following components:   Glucose, Bld 178 (*)    All other components within normal limits  GLUCOSE, CAPILLARY - Abnormal; Notable for the following components:   Glucose-Capillary 203 (*)    All other components within normal limits  CBC     IMAGES:   EKG:   CV: Myocardial Perfusion 03/19/2021   The study is normal. Findings are consistent with no prior ischemia and no prior myocardial infarction. The study is low risk.   No ST deviation was noted.    LV perfusion is normal. There is no evidence of ischemia. There is no evidence of infarction.   Left ventricular function is normal. Nuclear stress EF: 61 %. The left ventricular ejection fraction is normal (55-65%). End diastolic cavity size is normal. End systolic cavity size is normal.   Prior study not available for comparison.   Hypertensive response to exercise.   Past Medical History:  Diagnosis Date   Atrial fibrillation (HCC)    one occurence   Cancer (HCC)    basal cell skin cancer   GERD (gastroesophageal reflux disease)     Past Surgical History:  Procedure Laterality Date   ANKLE ARTHROSCOPY Right    COLONOSCOPY     KNEE ARTHROSCOPY Bilateral     MEDICATIONS:  cetirizine (ZYRTEC) 10 MG tablet   Empagliflozin -metFORMIN  HCl ER (SYNJARDY  XR) 25-1000 MG TB24   Evolocumab  (REPATHA  SURECLICK) 140 MG/ML SOAJ   fluorouracil  (EFUDEX ) 5 % cream   fluticasone  (FLONASE ) 50 MCG/ACT nasal spray   icosapent  Ethyl (VASCEPA ) 1 g capsule   Semaglutide , 2 MG/DOSE, (OZEMPIC , 2 MG/DOSE,) 8 MG/3ML SOPN   Tadalafil  2.5 MG TABS   No current facility-administered medications for this encounter.    Harlene Hoots Ward, PA-C [facility] Pre-Surgical Testing [phone]

## 2024-04-24 NOTE — H&P (Signed)
 Chief Complaint: Umbilical Hernia   Dr. Janey - [facility]   History of Present Illness: John Donaldson is a 68 y.o. male who is seen today as an office consultation at the request of Dr. Stephanie for evaluation of Umbilical Hernia .   This is a 68 year old male who presents with several years of a slowly enlarging bulge at his umbilicus.  He also has a bulge in his upper abdomen when he is bending over but this resolves when he is standing or supine.  He reports no pain with the hernia.  He does feel some fullness when he bends over.  No nausea or vomiting.  Bowel movements are normal.  The patient occasionally has some reflux problems.  He mentions all of these concerns to Dr. Janey who referred him to talk to us  about possible hernias.   The patient had a cardiac CT scan in 2023 and a chest x-ray in 2018 that showed no sign of hiatal hernia.   Review of Systems: A complete review of systems was obtained from the patient.  I have reviewed this information and discussed as appropriate with the patient.  See HPI as well for other ROS.   Review of Systems  Constitutional: Negative.   HENT: Negative.    Eyes: Negative.   Respiratory: Negative.    Cardiovascular: Negative.   Gastrointestinal: Negative.   Genitourinary: Negative.   Musculoskeletal: Negative.   Skin: Negative.   Neurological: Negative.   Endo/Heme/Allergies: Negative.   Psychiatric/Behavioral: Negative.          Medical History: Past Medical History         Past Medical History:  Diagnosis Date   Diabetes mellitus without complication (CMS/HHS-HCC)          Problem List       Patient Active Problem List  Diagnosis   Elevated coronary artery calcium  score   Pain of cervical spine   Right bundle branch block   Umbilical hernia without obstruction or gangrene   Rectus diastasis        Past Surgical History           Past Surgical History:  Procedure Laterality Date   EXCISION HYDROCELE BILATERAL       Knee surgery             Allergies  No Known Allergies      Medications Ordered Prior to Encounter             Current Outpatient Medications on File Prior to Visit  Medication Sig Dispense Refill   cetirizine (ZYRTEC) 10 MG tablet Take 10 mg by mouth once daily       empagliflozin -metFORMIN  (SYNJARDY  XR) 25-1,000 mg XR 24 hr biphasic tablet Take 1 tablet by mouth once daily       OZEMPIC  2 mg/dose (8 mg/3 mL) pen injector Inject 2 mg subcutaneously every 7 (seven) days       REPATHA  SURECLICK 140 mg/mL PnIj Inject 140 mg subcutaneously every 14 (fourteen) days       tadalafiL  (CIALIS ) 2.5 mg tablet Take 2.5 mg by mouth once daily       icosapent  ethyL (VASCEPA ) 1 gram capsule Take 2 g by mouth 2 (two) times daily (Patient not taking: Reported on 11/10/2023)        No current facility-administered medications on file prior to visit.        Family History           Family History  Problem  Relation Age of Onset   Stroke Mother     Hyperlipidemia (Elevated cholesterol) Mother     Coronary Artery Disease (Blocked arteries around heart) Mother     Skin cancer Father     High blood pressure (Hypertension) Father     Coronary Artery Disease (Blocked arteries around heart) Father          Tobacco Use History  Social History         Tobacco Use  Smoking Status Never  Smokeless Tobacco Never        Social History  Social History             Socioeconomic History   Marital status: Married  Tobacco Use   Smoking status: Never   Smokeless tobacco: Never  Substance and Sexual Activity   Alcohol use: Yes      Alcohol/week: 0.0 - 1.0 standard drinks of alcohol   Drug use: Never    Social Drivers of Health           Housing Stability: Unknown (11/10/2023)    Housing Stability Vital Sign     Homeless in the Last Year: No        Objective:             Vitals:    11/10/23 0937 11/10/23 0938  BP: (!) 150/98    Pulse: 90    Temp: 36.5 C (97.7 F)    SpO2: 97%    Weight: (!)  108.4 kg (239 lb)    Height: 195.6 cm (6' 5)    PainSc:   0-No pain    Body mass index is 28.34 kg/m.   Physical Exam    Constitutional:  WDWN in NAD, conversant, no obvious deformities; lying in bed comfortably Eyes:  Pupils equal, round; sclera anicteric; moist conjunctiva; no lid lag HENT:  Oral mucosa moist; good dentition  Neck:  No masses palpated, trachea midline; no thyromegaly Lungs:  CTA bilaterally; normal respiratory effort CV:  Regular rate and rhythm; no murmurs; extremities well-perfused with no edema Abd:  +bowel sounds, soft, non-tender, no palpable organomegaly; patient has a upper midline rectus diastases when contracting his abdominal muscles.  No sign of ventral hernia in these areas.  Patient has a small reducible umbilical hernia with a 1.5 cm fascial defect.  The hernia seems to contain only a small amount of fat. GU: Bilateral descended testes, no testicular masses, no sign of inguinal hernia on either side. Musc: Normal gait; no apparent clubbing or cyanosis in extremities Lymphatic:  No palpable cervical or axillary lymphadenopathy Skin:  Warm, dry; no sign of jaundice Psychiatric - alert and oriented x 4; calm mood and affect       Assessment and Plan:  Diagnoses and all orders for this visit:   Umbilical hernia without obstruction or gangrene   Rectus diastasis       No intervention recommended for rectus diastases. Recommend umbilical hernia repair with mesh..The surgical procedure has been discussed with the patient.  Potential risks, benefits, alternative treatments, and expected outcomes have been explained.  All of the patient's questions at this time have been answered.  The likelihood of reaching the patient's treatment goal is good.  The patient understands the proposed surgical procedure and wishes to proceed. The patient wants to wait several months before having his surgery.  We discussed the possibility of incarceration or strangulation but  I think that is very low risk at this time.  He will call us   about a month before he wants to have his surgery performed.  Donnice POUR. Belinda, MD, [facility]  General Surgery   04/24/2024 7:53 PM

## 2024-04-25 ENCOUNTER — Ambulatory Visit (HOSPITAL_COMMUNITY): Payer: Self-pay | Admitting: Physician Assistant

## 2024-04-25 ENCOUNTER — Ambulatory Visit (HOSPITAL_COMMUNITY)
Admission: RE | Admit: 2024-04-25 | Discharge: 2024-04-25 | Disposition: A | Discharge status: Home / Self Care | Source: Ambulatory Visit | Attending: Surgery | Admitting: Surgery

## 2024-04-25 ENCOUNTER — Encounter (HOSPITAL_COMMUNITY): Payer: Self-pay | Admitting: Surgery

## 2024-04-25 ENCOUNTER — Other Ambulatory Visit: Payer: Self-pay

## 2024-04-25 ENCOUNTER — Encounter (HOSPITAL_COMMUNITY)
Admission: RE | Disposition: A | Discharge status: Home / Self Care | Payer: Self-pay | Source: Ambulatory Visit | Attending: Surgery

## 2024-04-25 ENCOUNTER — Ambulatory Visit (HOSPITAL_COMMUNITY): Admitting: Anesthesiology

## 2024-04-25 ENCOUNTER — Other Ambulatory Visit (HOSPITAL_COMMUNITY): Payer: Self-pay

## 2024-04-25 DIAGNOSIS — K429 Umbilical hernia without obstruction or gangrene: Secondary | ICD-10-CM | POA: Diagnosis not present

## 2024-04-25 HISTORY — PX: UMBILICAL HERNIA REPAIR: SHX196

## 2024-04-25 LAB — GLUCOSE, CAPILLARY
Glucose-Capillary: 224 mg/dL — ABNORMAL HIGH (ref 70–99)
Glucose-Capillary: 191 mg/dL — ABNORMAL HIGH (ref 70–99)

## 2024-04-25 SURGERY — REPAIR, HERNIA, UMBILICAL, ADULT
Anesthesia: General

## 2024-04-25 MED ORDER — OXYCODONE HCL 5 MG/5ML PO SOLN: 5.0000 mg | Freq: Once | ORAL | Status: DC | PRN

## 2024-04-25 MED ORDER — SUGAMMADEX SODIUM 200 MG/2ML IV SOLN
INTRAVENOUS | Status: DC | PRN
  Administered 2024-04-25: 200 mg via INTRAVENOUS

## 2024-04-25 MED ORDER — FENTANYL CITRATE (PF) 100 MCG/2ML IJ SOLN
INTRAMUSCULAR | Status: AC
  Filled 2024-04-25: qty 2

## 2024-04-25 MED ORDER — FENTANYL CITRATE (PF) 100 MCG/2ML IJ SOLN
INTRAMUSCULAR | Status: DC | PRN
  Administered 2024-04-25: 25 ug via INTRAVENOUS
  Administered 2024-04-25: 75 ug via INTRAVENOUS

## 2024-04-25 MED ORDER — CHLORHEXIDINE GLUCONATE CLOTH 2 % EX PADS: 6.0000 | MEDICATED_PAD | Freq: Once | CUTANEOUS | Status: DC

## 2024-04-25 MED ORDER — ORAL CARE MOUTH RINSE: 15.0000 mL | Freq: Once | OROMUCOSAL | Status: AC

## 2024-04-25 MED ORDER — INSULIN ASPART 100 UNIT/ML IJ SOLN
0.0000 [IU] | INTRAMUSCULAR | Status: DC | PRN
  Administered 2024-04-25: 6 [IU] via SUBCUTANEOUS
  Filled 2024-04-25: qty 6

## 2024-04-25 MED ORDER — 0.9 % SODIUM CHLORIDE (POUR BTL) OPTIME
TOPICAL | Status: DC | PRN
  Administered 2024-04-25: 1000 mL

## 2024-04-25 MED ORDER — KETOROLAC TROMETHAMINE 30 MG/ML IJ SOLN
INTRAMUSCULAR | Status: DC | PRN
  Administered 2024-04-25: 30 mg via INTRAVENOUS

## 2024-04-25 MED ORDER — KETOROLAC TROMETHAMINE 30 MG/ML IJ SOLN
INTRAMUSCULAR | Status: AC
  Filled 2024-04-25: qty 1

## 2024-04-25 MED ORDER — LIDOCAINE HCL (PF) 2 % IJ SOLN
INTRAMUSCULAR | Status: AC
  Filled 2024-04-25: qty 5

## 2024-04-25 MED ORDER — LIDOCAINE HCL (PF) 2 % IJ SOLN
INTRAMUSCULAR | Status: DC | PRN
  Administered 2024-04-25: 100 mg via INTRADERMAL

## 2024-04-25 MED ORDER — MIDAZOLAM HCL 2 MG/2ML IJ SOLN
INTRAMUSCULAR | Status: AC
  Filled 2024-04-25: qty 2

## 2024-04-25 MED ORDER — SUGAMMADEX SODIUM 200 MG/2ML IV SOLN
INTRAVENOUS | Status: AC
  Filled 2024-04-25: qty 2

## 2024-04-25 MED ORDER — PROPOFOL 10 MG/ML IV BOLUS
INTRAVENOUS | Status: DC | PRN
  Administered 2024-04-25: 150 mg via INTRAVENOUS

## 2024-04-25 MED ORDER — ONDANSETRON HCL 4 MG/2ML IJ SOLN
INTRAMUSCULAR | Status: DC | PRN
  Administered 2024-04-25: 4 mg via INTRAVENOUS

## 2024-04-25 MED ORDER — PHENYLEPHRINE 80 MCG/ML (10ML) SYRINGE FOR IV PUSH (FOR BLOOD PRESSURE SUPPORT)
PREFILLED_SYRINGE | INTRAVENOUS | Status: AC
  Filled 2024-04-25: qty 10

## 2024-04-25 MED ORDER — PHENYLEPHRINE 80 MCG/ML (10ML) SYRINGE FOR IV PUSH (FOR BLOOD PRESSURE SUPPORT)
PREFILLED_SYRINGE | INTRAVENOUS | Status: DC | PRN
  Administered 2024-04-25 (×2): 160 ug via INTRAVENOUS

## 2024-04-25 MED ORDER — ONDANSETRON HCL 4 MG/2ML IJ SOLN
INTRAMUSCULAR | Status: AC
  Filled 2024-04-25: qty 2

## 2024-04-25 MED ORDER — MIDAZOLAM HCL (PF) 2 MG/2ML IJ SOLN
INTRAMUSCULAR | Status: DC | PRN
  Administered 2024-04-25: 2 mg via INTRAVENOUS

## 2024-04-25 MED ORDER — OXYCODONE HCL 5 MG PO TABS: 5.0000 mg | ORAL_TABLET | Freq: Once | ORAL | Status: DC | PRN

## 2024-04-25 MED ORDER — ACETAMINOPHEN 500 MG PO TABS
1000.0000 mg | ORAL_TABLET | ORAL | Status: AC
  Administered 2024-04-25: 1000 mg via ORAL
  Filled 2024-04-25: qty 2

## 2024-04-25 MED ORDER — OXYCODONE HCL 5 MG PO TABS
5.0000 mg | ORAL_TABLET | Freq: Four times a day (QID) | ORAL | 0 refills | Status: AC | PRN
  Filled 2024-04-25: qty 20, 5d supply, fill #0

## 2024-04-25 MED ORDER — CHLORHEXIDINE GLUCONATE 0.12 % MT SOLN
15.0000 mL | Freq: Once | OROMUCOSAL | Status: AC
  Administered 2024-04-25: 15 mL via OROMUCOSAL

## 2024-04-25 MED ORDER — AMISULPRIDE (ANTIEMETIC) 5 MG/2ML IV SOLN: 10.0000 mg | Freq: Once | INTRAVENOUS | Status: DC | PRN

## 2024-04-25 MED ORDER — LACTATED RINGERS IV SOLN: INTRAVENOUS | Status: DC

## 2024-04-25 MED ORDER — BUPIVACAINE-EPINEPHRINE 0.25% -1:200000 IJ SOLN
INTRAMUSCULAR | Status: DC | PRN
  Administered 2024-04-25: 10 mL

## 2024-04-25 MED ORDER — EPHEDRINE 5 MG/ML INJ
INTRAVENOUS | Status: AC
  Filled 2024-04-25: qty 5

## 2024-04-25 MED ORDER — ROCURONIUM BROMIDE 10 MG/ML (PF) SYRINGE
PREFILLED_SYRINGE | INTRAVENOUS | Status: DC | PRN
  Administered 2024-04-25: 50 mg via INTRAVENOUS

## 2024-04-25 MED ORDER — HYDROMORPHONE HCL 1 MG/ML IJ SOLN: 0.2500 mg | INTRAMUSCULAR | Status: DC | PRN

## 2024-04-25 MED ORDER — PROPOFOL 10 MG/ML IV BOLUS
INTRAVENOUS | Status: AC
  Filled 2024-04-25: qty 20

## 2024-04-25 MED ORDER — CEFAZOLIN SODIUM-DEXTROSE 2-4 GM/100ML-% IV SOLN
2.0000 g | INTRAVENOUS | Status: AC
  Administered 2024-04-25: 2 g via INTRAVENOUS
  Filled 2024-04-25: qty 100

## 2024-04-25 MED ORDER — BUPIVACAINE-EPINEPHRINE (PF) 0.25% -1:200000 IJ SOLN
INTRAMUSCULAR | Status: AC
  Filled 2024-04-25: qty 30

## 2024-04-25 MED ORDER — ROCURONIUM BROMIDE 10 MG/ML (PF) SYRINGE
PREFILLED_SYRINGE | INTRAVENOUS | Status: AC
  Filled 2024-04-25: qty 10

## 2024-04-25 MED ORDER — MEPERIDINE HCL 25 MG/ML IJ SOLN: 6.2500 mg | INTRAMUSCULAR | Status: DC | PRN

## 2024-04-25 SURGICAL SUPPLY — 30 items
BAG COUNTER SPONGE SURGICOUNT (BAG) IMPLANT
BENZOIN TINCTURE PRP APPL 2/3 (GAUZE/BANDAGES/DRESSINGS) ×1 IMPLANT
BLADE HEX COATED 2.75 (ELECTRODE) ×1 IMPLANT
BLADE SURG 15 STRL LF DISP TIS (BLADE) ×1 IMPLANT
CHLORAPREP W/TINT 26 (MISCELLANEOUS) ×1 IMPLANT
COVER SURGICAL LIGHT HANDLE (MISCELLANEOUS) ×1 IMPLANT
DRAPE LAPAROTOMY T 102X78X121 (DRAPES) ×1 IMPLANT
DRSG TEGADERM 4X4.75 (GAUZE/BANDAGES/DRESSINGS) ×1 IMPLANT
ELECT REM PT RETURN 15FT ADLT (MISCELLANEOUS) ×1 IMPLANT
GAUZE SPONGE 4X4 12PLY STRL (GAUZE/BANDAGES/DRESSINGS) IMPLANT
GLOVE BIO SURGEON STRL SZ7 (GLOVE) ×1 IMPLANT
GLOVE BIOGEL PI IND STRL 7.5 (GLOVE) ×1 IMPLANT
GOWN STRL REUS W/ TWL LRG LVL3 (GOWN DISPOSABLE) ×1 IMPLANT
KIT BASIN OR (CUSTOM PROCEDURE TRAY) ×1 IMPLANT
KIT TURNOVER KIT A (KITS) ×1 IMPLANT
MESH VENTRALEX ST 1-7/10 CRC S (Mesh General) IMPLANT
NDL HYPO 22X1.5 SAFETY MO (MISCELLANEOUS) ×1 IMPLANT
PACK BASIC VI WITH GOWN DISP (CUSTOM PROCEDURE TRAY) ×1 IMPLANT
PACK GENERAL/GYN (CUSTOM PROCEDURE TRAY) IMPLANT
PENCIL SMOKE EVACUATOR (MISCELLANEOUS) IMPLANT
SPIKE FLUID TRANSFER (MISCELLANEOUS) ×1 IMPLANT
SPONGE T-LAP 4X18 ~~LOC~~+RFID (SPONGE) ×1 IMPLANT
STRIP CLOSURE SKIN 1/2X4 (GAUZE/BANDAGES/DRESSINGS) ×1 IMPLANT
SUT MNCRL AB 4-0 PS2 18 (SUTURE) ×1 IMPLANT
SUT NOVA NAB GS-21 0 18 T12 DT (SUTURE) IMPLANT
SUT PROLENE 0 CT 2 (SUTURE) IMPLANT
SUT VIC AB 3-0 SH 27XBRD (SUTURE) ×1 IMPLANT
SYR CONTROL 10ML LL (SYRINGE) ×1 IMPLANT
TAPE STRIPS DRAPE STRL (GAUZE/BANDAGES/DRESSINGS) IMPLANT
TOWEL OR 17X26 10 PK STRL BLUE (TOWEL DISPOSABLE) ×1 IMPLANT

## 2024-04-25 NOTE — Op Note (Signed)
 Indications:  This is a 69 year old male who presents with several years of a slowly enlarging bulge at his umbilicus. He also has a bulge in his upper abdomen when he is bending over but this resolves when he is standing or supine. He reports no pain with the hernia. He does feel some fullness when he bends over. No nausea or vomiting. Bowel movements are normal. The patient occasionally has some reflux problems. He mentions all of these concerns to Dr. Janey who referred him to talk to us  about possible hernias.   Pre-operative diagnosis:  Umbilical hernia  Post-operative diagnosis:  Same  Procedure:  Umbilical hernia repair with mesh  Procedure Details  The patient was seen again in the Holding Room. The risks, benefits, complications, treatment options, and expected outcomes were discussed with the patient. The possibilities of reaction to medication, pulmonary aspiration, perforation of viscus, bleeding, recurrent infection, the need for additional procedures, and development of a complication requiring transfusion or further operation were discussed with the patient and/or family. There was concurrence with the proposed plan, and informed consent was obtained. The site of surgery was properly noted/marked. The patient was taken to the Operating Room, identified as John Donaldson, and the procedure verified as umbilical hernia repair. A Time Out was held and the above information confirmed.  After an adequate level of general anesthesia was obtained, the patient's abdomen was prepped with Chloraprep and draped in sterile fashion.  We made a transverse incision above the umbilicus.  Dissection was carried down to the hernia sac with cautery.  We dissected bluntly around the hernia sac down to the edge of the fascial defect.  We reduced the hernia sac back into the pre-peritoneal space.  The fascial defect measured 1.5 cm.  We cleared the fascia in all directions.  A small Ventralex mesh was inserted  into the pre-peritoneal space and was deployed.  The mesh was secured with four trans-fascial sutures of 0 Novofil.  The fascial defect was closed with multiple interrupted figure-of-eight 0 Novofil sutures.  The base of the umbilicus was tacked down with 3-0 Vicryl.  3-0 Vicryl was used to close the subcutaneous tissues and 4-0 Monocryl was used to close the skin.  Steri-strips and clean dressing were applied.  The patient was extubated and brought to the recovery room in stable condition.  All sponge, instrument, and needle counts were correct prior to closure and at the conclusion of the case.   Estimated Blood Loss: Minimal          Complications: None; patient tolerated the procedure well.         Disposition: PACU - hemodynamically stable.         Condition: stable  Donnice POUR. Belinda, MD, [facility]  General Surgery   04/25/2024 12:45 PM

## 2024-04-25 NOTE — Anesthesia Procedure Notes (Signed)
 Procedure Name: Intubation Date/Time: 04/25/2024 12:06 PM  Performed by: Nada Corean CROME, CRNAPre-anesthesia Checklist: Emergency Drugs available, Patient identified, Suction available, Patient being monitored and Timeout performed Patient Re-evaluated:Patient Re-evaluated prior to induction Oxygen Delivery Method: Circle system utilized Preoxygenation: Pre-oxygenation with 100% oxygen Induction Type: IV induction Ventilation: Mask ventilation without difficulty Laryngoscope Size: Mac and 4 Grade View: Grade III Tube type: Oral Tube size: 7.5 mm Number of attempts: 2 Airway Equipment and Method: Stylet Placement Confirmation: ETT inserted through vocal cords under direct vision, positive ETCO2 and breath sounds checked- equal and bilateral Secured at: 23 cm Tube secured with: Tape Dental Injury: Teeth and Oropharynx as per pre-operative assessment  Comments: Intubation performed by Bonnie Finder, paramedic student.  Two attempts were made, the first attempt unsuccessful, second attempt by S. Fitzgerald Dunne, CRNA successful.

## 2024-04-25 NOTE — Anesthesia Postprocedure Evaluation (Signed)
 Anesthesia Post Note  Patient: John Donaldson  Procedure(s) Performed: REPAIR, HERNIA, UMBILICAL, ADULT     Patient location during evaluation: PACU Anesthesia Type: General Level of consciousness: awake and alert Pain management: pain level controlled Vital Signs Assessment: post-procedure vital signs reviewed and stable Respiratory status: spontaneous breathing, nonlabored ventilation and respiratory function stable Cardiovascular status: blood pressure returned to baseline and stable Postop Assessment: no apparent nausea or vomiting Anesthetic complications: no   No notable events documented.  Last Vitals:  Vitals:   04/25/24 1345 04/25/24 1400  BP: 129/84 123/74  Pulse: 62 62  Resp: 14 12  Temp:    SpO2: 98% 97%    Last Pain:  Vitals:   04/25/24 1345  TempSrc:   PainSc: 0-No pain                 Butler Levander Pinal

## 2024-04-25 NOTE — Interval H&P Note (Signed)
 History and Physical Interval Note:  04/25/2024 11:23 AM  John Donaldson  has presented today for surgery, with the diagnosis of UMBILICAL HERNIA.  The various methods of treatment have been discussed with the patient and family. After consideration of risks, benefits and other options for treatment, the patient has consented to  Procedures with comments: REPAIR, HERNIA, UMBILICAL, ADULT (N/A) - UMBILICAL HERNIA, MESH LMA as a surgical intervention.  The patient's history has been reviewed, patient examined, no change in status, stable for surgery.  I have reviewed the patient's chart and labs.  Questions were answered to the patient's satisfaction.     [name]

## 2024-04-25 NOTE — Anesthesia Preprocedure Evaluation (Signed)
 Anesthesia Evaluation  Patient identified by MRN, date of birth, ID band Patient awake    Reviewed: Allergy & Precautions, H&P , NPO status , Patient's Chart, lab work & pertinent test results  Airway Mallampati: II  TM Distance: >3 FB Neck ROM: Full    Dental  (+) Dental Advisory Given   Pulmonary neg pulmonary ROS, former smoker   Pulmonary exam normal breath sounds clear to auscultation       Cardiovascular + CAD  Normal cardiovascular exam Rhythm:Regular Rate:Normal     Neuro/Psych negative neurological ROS  negative psych ROS   GI/Hepatic Neg liver ROS,GERD  ,,  Endo/Other  negative endocrine ROS    Renal/GU negative Renal ROS  negative genitourinary   Musculoskeletal negative musculoskeletal ROS (+)    Abdominal   Peds negative pediatric ROS (+)  Hematology negative hematology ROS (+)   Anesthesia Other Findings   Reproductive/Obstetrics negative OB ROS                              Anesthesia Physical Anesthesia Plan  ASA: 2  Anesthesia Plan: General   Post-op Pain Management:    Induction: Intravenous  PONV Risk Score and Plan: 2 and Ondansetron, Midazolam and Treatment may vary due to age or medical condition  Airway Management Planned: Oral ETT  Additional Equipment:   Intra-op Plan:   Post-operative Plan: Extubation in OR  Informed Consent: I have reviewed the patients History and Physical, chart, labs and discussed the procedure including the risks, benefits and alternatives for the proposed anesthesia with the patient or authorized representative who has indicated his/her understanding and acceptance.     Dental advisory given  Plan Discussed with: CRNA  Anesthesia Plan Comments:         Anesthesia Quick Evaluation

## 2024-04-25 NOTE — Discharge Instructions (Signed)
 [facility] _______[facility]  UMBILICAL HERNIA REPAIR: POST OP INSTRUCTIONS  Always review your discharge instruction sheet given to you by the facility where your surgery was performed. IF YOU HAVE DISABILITY OR FAMILY LEAVE FORMS, YOU MUST BRING THEM TO THE OFFICE FOR PROCESSING.   DO NOT GIVE THEM TO YOUR DOCTOR.  1. A  prescription for pain medication may be given to you upon discharge.  Take your pain medication as prescribed, if needed.  If narcotic pain medicine is not needed, then you may take acetaminophen (Tylenol) or ibuprofen (Advil) as needed. 2. Take your usually prescribed medications unless otherwise directed. If you need a refill on your pain medication, please contact your pharmacy.  They will contact our office to request authorization. Prescriptions will not be filled after 5 pm or on week-ends. 3. You should follow a light diet the first 24 hours after arrival home, such as soup and crackers, etc.  Be sure to include lots of fluids daily.  Resume your normal diet the day after surgery. 4.Most patients will experience some swelling and bruising around the umbilicus.  Ice packs and reclining will help.  Swelling and bruising can take several days to resolve.  6. It is common to experience some constipation if taking pain medication after surgery.  Increasing fluid intake and taking a stool softener (such as Colace) will usually help or prevent this problem from occurring.  A mild laxative (Milk of Magnesia or Miralax) should be taken according to package directions if there are no bowel movements after 48 hours. 7. Unless discharge instructions indicate otherwise, you may remove your bandages 24-48 hours after surgery, and you may shower at that time.  You may have steri-strips (small skin tapes) in place directly over the incision.   8. ACTIVITIES:  You may resume regular (light) daily activities beginning the next day--such as daily self-care, walking, climbing  stairs--gradually increasing activities as tolerated.  You may have sexual intercourse when it is comfortable.  Refrain from any heavy lifting or straining until approved by your doctor.  a.You may drive when you are no longer taking prescription pain medication, you can comfortably wear a seatbelt, and you can safely maneuver your car and apply brakes. b.RETURN TO WORK:   _____________________________________________  9.You should see your doctor in the office for a follow-up appointment approximately 2-3 weeks after your surgery.  Make sure that you call for this appointment within a day or two after you arrive home to insure a convenient appointment time. 10.OTHER INSTRUCTIONS: _________________________    _____________________________________  WHEN TO CALL YOUR DOCTOR: Fever over 101.0 Inability to urinate Nausea and/or vomiting Extreme swelling or bruising Continued bleeding from incision. Increased pain, redness, or drainage from the incision  The clinic staff is available to answer your questions during regular business hours.  Please dont hesitate to call and ask to speak to one of the nurses for clinical concerns.  If you have a medical emergency, go to the nearest emergency room or call 911.  A surgeon from [facility] is always on call at the hospital   [address], [address], [place], [place]  [address] ?  [address] [address], [place], [place]   [address] [phone] ? [phone] ? FAX [phone] Web site: [email or web address]

## 2024-04-25 NOTE — Transfer of Care (Signed)
 Immediate Anesthesia Transfer of Care Note  Patient: John Donaldson  Procedure(s) Performed: REPAIR, HERNIA, UMBILICAL, ADULT  Patient Location: PACU  Anesthesia Type:General  Level of Consciousness: awake, alert , oriented, and patient cooperative  Airway & Oxygen Therapy: Patient Spontanous Breathing and Patient connected to face mask oxygen  Post-op Assessment: Report given to RN and Post -op Vital signs reviewed and stable  Post vital signs: Reviewed and stable  Last Vitals:  Vitals Value Taken Time  BP 138/81 04/25/24 12:53  Temp    Pulse 68 04/25/24 12:54  Resp 12 04/25/24 12:54  SpO2 100 % 04/25/24 12:54  Vitals shown include unfiled device data.  Last Pain:  Vitals:   04/25/24 1016  TempSrc: Oral  PainSc: 0-No pain         Complications: No notable events documented.

## 2024-04-26 ENCOUNTER — Encounter (HOSPITAL_COMMUNITY): Payer: Self-pay | Admitting: Surgery

## 2024-05-08 ENCOUNTER — Other Ambulatory Visit: Payer: Self-pay

## 2024-05-08 ENCOUNTER — Other Ambulatory Visit (HOSPITAL_COMMUNITY): Payer: Self-pay

## 2024-05-11 ENCOUNTER — Other Ambulatory Visit (HOSPITAL_COMMUNITY): Payer: Self-pay

## 2024-05-24 ENCOUNTER — Other Ambulatory Visit (HOSPITAL_COMMUNITY): Payer: Self-pay

## 2024-05-24 ENCOUNTER — Other Ambulatory Visit: Payer: Self-pay

## 2024-05-26 ENCOUNTER — Other Ambulatory Visit (HOSPITAL_COMMUNITY): Payer: Self-pay

## 2024-05-27 ENCOUNTER — Other Ambulatory Visit (HOSPITAL_COMMUNITY): Payer: Self-pay

## 2024-05-27 MED ORDER — OZEMPIC (2 MG/DOSE) 8 MG/3ML ~~LOC~~ SOPN
2.0000 | PEN_INJECTOR | SUBCUTANEOUS | 5 refills | Status: AC
  Filled 2024-05-27: qty 3, 28d supply, fill #0
